# Patient Record
Sex: Male | Born: 1985 | Race: Black or African American | Hispanic: No | Marital: Single | Smoking: Former smoker
Health system: Southern US, Community
[De-identification: ages and names within clinical notes are randomized; demographics above are authoritative.]

## PROBLEM LIST (undated history)

## (undated) DIAGNOSIS — J40 Bronchitis, not specified as acute or chronic: Secondary | ICD-10-CM

---

## 2007-01-19 ENCOUNTER — Ambulatory Visit: Payer: Self-pay | Admitting: Internal Medicine

## 2007-01-20 ENCOUNTER — Ambulatory Visit: Payer: Self-pay | Admitting: *Deleted

## 2007-05-28 ENCOUNTER — Ambulatory Visit: Payer: Self-pay | Admitting: Family Medicine

## 2007-11-20 ENCOUNTER — Ambulatory Visit: Payer: Self-pay | Admitting: Internal Medicine

## 2009-04-04 ENCOUNTER — Emergency Department (HOSPITAL_COMMUNITY): Admission: EM | Admit: 2009-04-04 | Discharge: 2009-04-05 | Payer: Self-pay | Admitting: Emergency Medicine

## 2010-02-26 ENCOUNTER — Emergency Department (HOSPITAL_COMMUNITY): Admission: EM | Admit: 2010-02-26 | Discharge: 2010-02-26 | Payer: Self-pay | Admitting: Emergency Medicine

## 2010-03-02 ENCOUNTER — Emergency Department (HOSPITAL_COMMUNITY): Admission: EM | Admit: 2010-03-02 | Discharge: 2010-03-02 | Payer: Self-pay | Admitting: Emergency Medicine

## 2012-07-18 ENCOUNTER — Emergency Department (HOSPITAL_COMMUNITY)
Admission: EM | Admit: 2012-07-18 | Discharge: 2012-07-18 | Disposition: A | Discharge status: Home / Self Care | Payer: Self-pay | Attending: Emergency Medicine | Admitting: Emergency Medicine

## 2012-07-18 ENCOUNTER — Encounter (HOSPITAL_COMMUNITY): Payer: Self-pay | Admitting: *Deleted

## 2012-07-18 DIAGNOSIS — R112 Nausea with vomiting, unspecified: Secondary | ICD-10-CM | POA: Insufficient documentation

## 2012-07-18 DIAGNOSIS — F17200 Nicotine dependence, unspecified, uncomplicated: Secondary | ICD-10-CM | POA: Insufficient documentation

## 2012-07-18 DIAGNOSIS — R197 Diarrhea, unspecified: Secondary | ICD-10-CM | POA: Insufficient documentation

## 2012-07-18 LAB — COMPREHENSIVE METABOLIC PANEL
ALT: 46 U/L (ref 0–53)
AST: 37 U/L (ref 0–37)
CO2: 27 mEq/L (ref 19–32)
Calcium: 10.2 mg/dL (ref 8.4–10.5)
Chloride: 103 mEq/L (ref 96–112)
Creatinine, Ser: 1 mg/dL (ref 0.50–1.35)
GFR calc Af Amer: >90 mL/min (ref >90)
GFR calc non Af Amer: >90 mL/min (ref >90)
Glucose, Bld: 109 mg/dL — ABNORMAL HIGH (ref 70–99)
Total Bilirubin: 1.4 mg/dL — ABNORMAL HIGH (ref 0.3–1.2)
Total Protein: 7.8 g/dL (ref 6.0–8.3)

## 2012-07-18 LAB — CBC WITH DIFFERENTIAL/PLATELET
Basophils Absolute: 0 10*3/uL (ref 0.0–0.1)
Eosinophils Relative: 1 % (ref 0–5)
Lymphocytes Relative: 9 % — ABNORMAL LOW (ref 12–46)
Lymphs Abs: 0.9 10*3/uL (ref 0.7–4.0)
MCH: 30.6 pg (ref 26.0–34.0)
MCHC: 34.4 g/dL (ref 30.0–36.0)
Monocytes Absolute: 0.6 10*3/uL (ref 0.1–1.0)
Neutro Abs: 8.5 10*3/uL — ABNORMAL HIGH (ref 1.7–7.7)
Neutrophils Relative %: 84 % — ABNORMAL HIGH (ref 43–77)
Platelets: 148 10*3/uL — ABNORMAL LOW (ref 150–400)
WBC: 10.1 10*3/uL (ref 4.0–10.5)

## 2012-07-18 LAB — URINALYSIS, MICROSCOPIC ONLY
Glucose, UA: NEGATIVE mg/dL
Hgb urine dipstick: NEGATIVE
Ketones, ur: 15 mg/dL — AB
Nitrite: NEGATIVE
Protein, ur: 30 mg/dL — AB
Urobilinogen, UA: 1 mg/dL (ref 0.0–1.0)

## 2012-07-18 LAB — LIPASE, BLOOD: Lipase: 17 U/L (ref 11–59)

## 2012-07-18 MED ORDER — ONDANSETRON 8 MG PO TBDP: 8.0000 mg | ORAL_TABLET | Freq: Three times a day (TID) | ORAL | Status: DC | PRN

## 2012-07-18 MED ORDER — SODIUM CHLORIDE 0.9 % IV BOLUS (SEPSIS)
1000.0000 mL | Freq: Once | INTRAVENOUS | Status: AC
  Administered 2012-07-18: 1000 mL via INTRAVENOUS

## 2012-07-18 MED ORDER — ONDANSETRON 4 MG PO TBDP
8.0000 mg | ORAL_TABLET | Freq: Once | ORAL | Status: AC
  Administered 2012-07-18: 8 mg via ORAL
  Filled 2012-07-18: qty 2

## 2012-07-18 MED ORDER — ONDANSETRON HCL 4 MG/2ML IJ SOLN
4.0000 mg | Freq: Once | INTRAMUSCULAR | Status: AC
  Administered 2012-07-18: 4 mg via INTRAVENOUS
  Filled 2012-07-18: qty 2

## 2012-07-18 MED ORDER — HYDROMORPHONE HCL PF 1 MG/ML IJ SOLN
1.0000 mg | Freq: Once | INTRAMUSCULAR | Status: AC
  Administered 2012-07-18: 1 mg via INTRAMUSCULAR
  Filled 2012-07-18: qty 1

## 2012-07-18 NOTE — ED Provider Notes (Signed)
History     CSN: [id number]  Arrival date & time 07/18/12  0214   First MD Initiated Contact with Patient 07/18/12 [phone]      Chief Complaint  Patient presents with  . Abdominal Pain     The history is provided by the patient and a relative.   the patient reports developing nausea vomiting and diarrhea to the evening.  He denies hematemesis.  No melena or hematochezia.  Reports after one episode of vomiting he had acute right upper quadrant pain which is now improved.  He denies fevers or chills.  No recent sick contacts.  No myalgias.  No chest pain or shortness of breath.  No cough.  His symptoms are mild to moderate in severity.  Nothing worsens or improves his symptoms  History reviewed. No pertinent past medical history.  History reviewed. No pertinent past surgical history.  History reviewed. No pertinent family history.  History  Substance Use Topics  . Smoking status: Current Every Day Smoker -- 0.2 packs/day  . Smokeless tobacco: Not on file  . Alcohol Use: Yes      Review of Systems  Gastrointestinal: Positive for abdominal pain.  All other systems reviewed and are negative.    Allergies  Review of patient's allergies indicates no known allergies.  Home Medications  No current outpatient prescriptions on file.  BP 100/62  Pulse 74  Temp 98.4 F (36.9 C) (Oral)  Resp 16  Ht 5\' 7"  (1.702 m)  Wt 175 lb (79.379 kg)  BMI 27.41 kg/m2  SpO2 98%  Physical Exam  Nursing note and vitals reviewed. Constitutional: He is oriented to person, place, and time. He appears well-developed and well-nourished.  HENT:  Head: Normocephalic and atraumatic.  Eyes: EOM are normal.  Neck: Normal range of motion.  Cardiovascular: Normal rate, regular rhythm, normal heart sounds and intact distal pulses.   Pulmonary/Chest: Effort normal and breath sounds normal. No respiratory distress.  Abdominal: Soft. He exhibits no distension and no mass. There is no tenderness.    Musculoskeletal: Normal range of motion.  Neurological: He is alert and oriented to person, place, and time.  Skin: Skin is warm and dry.  Psychiatric: He has a normal mood and affect. Judgment normal.    ED Course  Procedures (including critical care time)  Labs Reviewed  CBC WITH DIFFERENTIAL - Abnormal; Notable for the following:    Platelets 148 (*)     Neutrophils Relative 84 (*)     Neutro Abs 8.5 (*)     Lymphocytes Relative 9 (*)     All other components within normal limits  COMPREHENSIVE METABOLIC PANEL - Abnormal; Notable for the following:    Glucose, Bld 109 (*)     Total Bilirubin 1.4 (*)     All other components within normal limits  URINALYSIS, MICROSCOPIC ONLY - Abnormal; Notable for the following:    Color, Urine AMBER (*)  BIOCHEMICALS MAY BE AFFECTED BY COLOR   APPearance HAZY (*)     Specific Gravity, Urine 1.037 (*)     Bilirubin Urine SMALL (*)     Ketones, ur 15 (*)     Protein, ur 30 (*)     All other components within normal limits  LIPASE, BLOOD   No results found.   1. Nausea vomiting and diarrhea       MDM  5:14 AM The patient feels much better at this time.  His symptoms are controlled.  His repeat abdominal exam is  benign.  Discharge home in good condition.        Lyanne Co, MD 07/18/12 [phone]

## 2012-07-18 NOTE — ED Notes (Signed)
The patient is AOx4 and comfortable with his discharge instructions. 

## 2012-07-18 NOTE — ED Notes (Signed)
Pt c/o n/v/d all night.  When vomiting experienced sharp RUQ abdominal pain.

## 2013-02-10 ENCOUNTER — Emergency Department (HOSPITAL_COMMUNITY)
Admission: EM | Admit: 2013-02-10 | Discharge: 2013-02-10 | Disposition: A | Discharge status: Home / Self Care | Payer: Self-pay | Attending: Emergency Medicine | Admitting: Emergency Medicine

## 2013-02-10 ENCOUNTER — Encounter (HOSPITAL_COMMUNITY): Payer: Self-pay

## 2013-02-10 DIAGNOSIS — R81 Glycosuria: Secondary | ICD-10-CM | POA: Insufficient documentation

## 2013-02-10 DIAGNOSIS — R197 Diarrhea, unspecified: Secondary | ICD-10-CM | POA: Insufficient documentation

## 2013-02-10 DIAGNOSIS — R112 Nausea with vomiting, unspecified: Secondary | ICD-10-CM | POA: Insufficient documentation

## 2013-02-10 DIAGNOSIS — F17200 Nicotine dependence, unspecified, uncomplicated: Secondary | ICD-10-CM | POA: Insufficient documentation

## 2013-02-10 LAB — COMPREHENSIVE METABOLIC PANEL
AST: 22 U/L (ref 0–37)
BUN: 17 mg/dL (ref 6–23)
CO2: 25 mEq/L (ref 19–32)
Chloride: 103 mEq/L (ref 96–112)
Creatinine, Ser: 0.91 mg/dL (ref 0.50–1.35)
GFR calc Af Amer: >90 mL/min (ref >90)
GFR calc non Af Amer: >90 mL/min (ref >90)
Glucose, Bld: 120 mg/dL — ABNORMAL HIGH (ref 70–99)
Total Bilirubin: 0.9 mg/dL (ref 0.3–1.2)

## 2013-02-10 LAB — CBC WITH DIFFERENTIAL/PLATELET
HCT: 42.6 % (ref 39.0–52.0)
Hemoglobin: 15 g/dL (ref 13.0–17.0)
Lymphocytes Relative: 47 % — ABNORMAL HIGH (ref 12–46)
Lymphs Abs: 2.1 10*3/uL (ref 0.7–4.0)
MCV: 88.6 fL (ref 78.0–100.0)
Monocytes Absolute: 0.3 10*3/uL (ref 0.1–1.0)
Monocytes Relative: 8 % (ref 3–12)
Neutro Abs: 1.9 10*3/uL (ref 1.7–7.7)
WBC: 4.5 10*3/uL (ref 4.0–10.5)

## 2013-02-10 LAB — URINALYSIS, ROUTINE W REFLEX MICROSCOPIC
Hgb urine dipstick: NEGATIVE
Protein, ur: NEGATIVE mg/dL
Urobilinogen, UA: 1 mg/dL (ref 0.0–1.0)

## 2013-02-10 LAB — POCT I-STAT, CHEM 8
Creatinine, Ser: 1 mg/dL (ref 0.50–1.35)
Glucose, Bld: 117 mg/dL — ABNORMAL HIGH (ref 70–99)
Hemoglobin: 17 g/dL (ref 13.0–17.0)
Sodium: 137 mEq/L (ref 135–145)
TCO2: 25 mmol/L (ref 0–100)

## 2013-02-10 MED ORDER — ONDANSETRON HCL 4 MG/2ML IJ SOLN
4.0000 mg | Freq: Once | INTRAMUSCULAR | Status: AC
  Administered 2013-02-10: 4 mg via INTRAVENOUS
  Filled 2013-02-10: qty 2

## 2013-02-10 MED ORDER — SODIUM CHLORIDE 0.9 % IV BOLUS (SEPSIS)
1000.0000 mL | Freq: Once | INTRAVENOUS | Status: AC
  Administered 2013-02-10: 1000 mL via INTRAVENOUS

## 2013-02-10 MED ORDER — DICYCLOMINE HCL 20 MG PO TABS
10.0000 mg | ORAL_TABLET | Freq: Once | ORAL | Status: AC
  Administered 2013-02-10: 10 mg via ORAL
  Filled 2013-02-10: qty 2

## 2013-02-10 MED ORDER — DICYCLOMINE HCL 10 MG PO CAPS: 10.0000 mg | ORAL_CAPSULE | Freq: Three times a day (TID) | ORAL | Status: DC

## 2013-02-10 MED ORDER — MORPHINE SULFATE 4 MG/ML IJ SOLN
2.0000 mg | Freq: Once | INTRAMUSCULAR | Status: AC
  Administered 2013-02-10: 2 mg via INTRAVENOUS
  Filled 2013-02-10: qty 1

## 2013-02-10 MED ORDER — PROMETHAZINE HCL 25 MG PO TABS: 25.0000 mg | ORAL_TABLET | Freq: Four times a day (QID) | ORAL | Status: DC | PRN

## 2013-02-10 NOTE — ED Notes (Signed)
Pt reports diarrhea for 2 weeks. Symptom accompanied by n/vx2 that started today. Pt states taking pepto bismol and ginger ale with no relief. Pt denies any fevers.

## 2013-02-10 NOTE — ED Provider Notes (Signed)
CSN: [id number]     Arrival date & time 02/10/13  0104 History     First MD Initiated Contact with Patient 02/10/13 0115     Chief Complaint  Patient presents with  . Abdominal Pain  . Nausea  . Emesis   (Consider location/radiation/quality/duration/timing/severity/associated sxs/prior Treatment) HPI  Dustin Watkins is a 27 y.o. male complaining of watery diarrhea stable and severity for 2 weeks. Patient denies hematochezia or melena. Patient has positive sick contacts: Coworkers were always in the day got better. Patient also had 2 episodes of nonbloody, nonbilious, non-coffee ground emesis today. Patient denies recent antibiotics, fever, recent travel, mucousy stool.  Patient reports mild discomfort after episodes of diarrhea and emesis rated at 5/10, described as aching and sharp. No Alleviating factors identified.   History reviewed. No pertinent past medical history. History reviewed. No pertinent past surgical history. No family history on file. History  Substance Use Topics  . Smoking status: Current Every Day Smoker -- 0.20 packs/day    Types: Cigarettes  . Smokeless tobacco: Not on file  . Alcohol Use: Yes    Review of Systems 10 systems reviewed and found to be negative, except as noted in the HPI   Allergies  Review of patient's allergies indicates no known allergies.  Home Medications   Current Outpatient Rx  Name  Route  Sig  Dispense  Refill  . bismuth subsalicylate (PEPTO BISMOL) 262 MG chewable tablet   Oral   Chew 524 mg by mouth as needed for indigestion.          BP 144/69  Pulse 79  Temp(Src) 98.9 F (37.2 C) (Oral)  Resp 18  SpO2 100% Physical Exam  Nursing note and vitals reviewed. Constitutional: He is oriented to person, place, and time. He appears well-developed and well-nourished. No distress.  HENT:  Head: Normocephalic.  Eyes: Conjunctivae and EOM are normal.  Cardiovascular: Normal rate.   Pulmonary/Chest: Effort normal. No  stridor. No respiratory distress. He has no wheezes. He has no rales. He exhibits no tenderness.  Abdominal: Soft. Bowel sounds are normal. He exhibits no distension and no mass. There is tenderness. There is no rebound and no guarding.  Mild diffuse tenderness to palpation, no guarding or rebound  Musculoskeletal: Normal range of motion.  Neurological: He is alert and oriented to person, place, and time.  Psychiatric: He has a normal mood and affect.    ED Course   Procedures (including critical care time)  Labs Reviewed  CBC WITH DIFFERENTIAL - Abnormal; Notable for the following:    Neutrophils Relative % 42 (*)    Lymphocytes Relative 47 (*)    All other components within normal limits  URINALYSIS, ROUTINE W REFLEX MICROSCOPIC - Abnormal; Notable for the following:    Specific Gravity, Urine 1.036 (*)    Glucose, UA 250 (*)    All other components within normal limits  COMPREHENSIVE METABOLIC PANEL   No results found. 1. Diarrhea   2. Glucosuria     MDM   Filed Vitals:   02/10/13 0120  BP: 144/69  Pulse: 79  Temp: 98.9 F (37.2 C)  TempSrc: Oral  Resp: 18  SpO2: 100%     Dustin Watkins is a 27 y.o. male with 2 weeks of diarrhea and 2 episodes of emesis today. No electrolyte abnormalities. Abdominal exam is benign. Due to the length of time that she's been having his symptoms I think further testing is indicated.  Patient is unable to  provide stool samples for testing in the ED. Patient will be sent home with a kit for sample collection and asked to return a sample to the emergency room.  Patient has elevated blood sugar and glucosuria. Upon further questioning it turns out the patient has a strong family history of diabetes and also poly-did see a polyuria. I will refer him to the [facility] for primary care management.  Medications  sodium chloride 0.9 % bolus 1,000 mL (not administered)  morphine 4 MG/ML injection 2 mg (not administered)   ondansetron (ZOFRAN) injection 4 mg (not administered)    Pt is hemodynamically stable, appropriate for, and amenable to discharge at this time. Pt verbalized understanding and agrees with care plan. All questions answered. Outpatient follow-up and specific return precautions discussed.    Discharge Medication List as of 02/10/2013  3:44 AM    START taking these medications   Details  dicyclomine (BENTYL) 10 MG capsule Take 1 capsule (10 mg total) by mouth 4 (four) times daily -  before meals and at bedtime., Starting 02/10/2013, Until Discontinued, Print    promethazine (PHENERGAN) 25 MG tablet Take 1 tablet (25 mg total) by mouth every 6 (six) hours as needed for nausea., Starting 02/10/2013, Until Discontinued, Print        Note: Portions of this report may have been transcribed using voice recognition software. Every effort was made to ensure accuracy; however, inadvertent computerized transcription errors may be present    Wynetta Emery, PA-C 02/10/13 [phone]

## 2013-02-10 NOTE — ED Notes (Signed)
Stool specimen not obtained--- pt was given instructions on how to collect specimen and to bring specimen back here when able; printed lab requisition provided.

## 2013-02-11 NOTE — ED Provider Notes (Signed)
Medical screening examination/treatment/procedure(s) were performed by non-physician practitioner and as supervising physician I was immediately available for consultation/collaboration.  Sunnie Nielsen, MD 02/11/13 [phone]

## 2013-03-01 ENCOUNTER — Encounter: Payer: Self-pay | Admitting: Internal Medicine

## 2013-03-01 ENCOUNTER — Ambulatory Visit: Payer: Self-pay | Attending: Internal Medicine | Admitting: Internal Medicine

## 2013-03-01 VITALS — BP 121/81 | HR 67 | Temp 98.6°F | Ht 67.0 in | Wt 157.4 lb

## 2013-03-01 DIAGNOSIS — R7303 Prediabetes: Secondary | ICD-10-CM | POA: Insufficient documentation

## 2013-03-01 DIAGNOSIS — R7309 Other abnormal glucose: Secondary | ICD-10-CM | POA: Insufficient documentation

## 2013-03-01 DIAGNOSIS — R739 Hyperglycemia, unspecified: Secondary | ICD-10-CM | POA: Insufficient documentation

## 2013-03-01 LAB — GLUCOSE, POCT (MANUAL RESULT ENTRY): POC Glucose: 96 mg/dl (ref 70–99)

## 2013-03-02 LAB — COMPLETE METABOLIC PANEL WITH GFR
CO2: 30 mEq/L (ref 19–32)
Creat: 1.02 mg/dL (ref 0.50–1.35)
GFR, Est African American: >89 mL/min
GFR, Est Non African American: >89 mL/min
Glucose, Bld: 98 mg/dL (ref 70–99)
Total Bilirubin: 0.9 mg/dL (ref 0.3–1.2)

## 2013-03-02 LAB — LIPID PANEL
Cholesterol: 143 mg/dL (ref 0–200)
HDL: 45 mg/dL (ref >39)
Triglycerides: 40 mg/dL (ref <150)

## 2013-03-02 NOTE — Progress Notes (Signed)
Patient ID: Dustin Watkins, male   DOB: Jan 28, 1986, 28 y.o.   MRN: [id number]  CC: To establish care  HPI: Patient is a 27 years old pleasant man seen clinic today to establish care. He was recently seen in the ED for gastroenteritis where he was noticed to have glycosuria and has been sent here today to be tested for diabetes. He has no significant past medical history, and no subjective symptoms today. He claims to be doing well, smoke cigarette about one half pack per day, drinks alcohol patient. There is extensive family history of diabetes.  No Known Allergies No past medical history on file. Current Outpatient Prescriptions on File Prior to Visit  Medication Sig Dispense Refill  . bismuth subsalicylate (PEPTO BISMOL) 262 MG chewable tablet Chew 524 mg by mouth as needed for indigestion.      . dicyclomine (BENTYL) 10 MG capsule Take 1 capsule (10 mg total) by mouth 4 (four) times daily -  before meals and at bedtime.  15 capsule  0  . promethazine (PHENERGAN) 25 MG tablet Take 1 tablet (25 mg total) by mouth every 6 (six) hours as needed for nausea.  12 tablet  0   No current facility-administered medications on file prior to visit.   No family history on file. History   Social History  . Marital Status: Single    Spouse Name: N/A    Number of Children: N/A  . Years of Education: N/A   Occupational History  . Not on file.   Social History Main Topics  . Smoking status: Current Every Day Smoker -- 0.20 packs/day    Types: Cigarettes  . Smokeless tobacco: Not on file  . Alcohol Use: Yes  . Drug Use: Yes    Special: Marijuana  . Sexual Activity: Not on file   Other Topics Concern  . Not on file   Social History Narrative  . No narrative on file    Review of Systems: Constitutional: Negative for fever, chills, diaphoresis, activity change, appetite change and fatigue. HENT: Negative for ear pain, nosebleeds, congestion, facial swelling, rhinorrhea, neck pain, neck  stiffness and ear discharge.  Eyes: Negative for pain, discharge, redness, itching and visual disturbance. Respiratory: Negative for cough, choking, chest tightness, shortness of breath, wheezing and stridor.  Cardiovascular: Negative for chest pain, palpitations and leg swelling. Gastrointestinal: Negative for abdominal distention. Genitourinary: Negative for dysuria, urgency, frequency, hematuria, flank pain, decreased urine volume, difficulty urinating and dyspareunia.  Musculoskeletal: Negative for back pain, joint swelling, arthralgias and gait problem. Neurological: Negative for dizziness, tremors, seizures, syncope, facial asymmetry, speech difficulty, weakness, light-headedness, numbness and headaches.  Hematological: Negative for adenopathy. Does not bruise/bleed easily. Psychiatric/Behavioral: Negative for hallucinations, behavioral problems, confusion, dysphoric mood, decreased concentration and agitation.    Objective:   Filed Vitals:   03/01/13 1711  BP: 121/81  Pulse: 67  Temp: 98.6 F (37 C)    Physical Exam: Constitutional: Patient appears well-developed and well-nourished. No distress. HENT: Normocephalic, atraumatic, External right and left ear normal. Oropharynx is clear and moist.  Eyes: Conjunctivae and EOM are normal. PERRLA, no scleral icterus. Neck: Normal ROM. Neck supple. No JVD. No tracheal deviation. No thyromegaly. CVS: RRR, S1/S2 +, no murmurs, no gallops, no carotid bruit.  Pulmonary: Effort and breath sounds normal, no stridor, rhonchi, wheezes, rales.  Abdominal: Soft. BS +,  no distension, tenderness, rebound or guarding.  Musculoskeletal: Normal range of motion. No edema and no tenderness.  Lymphadenopathy: No lymphadenopathy noted, cervical,  inguinal or axillary Neuro: Alert. Normal reflexes, muscle tone coordination. No cranial nerve deficit. Skin: Skin is warm and dry. No rash noted. Not diaphoretic. No erythema. No pallor. Psychiatric: Normal  mood and affect. Behavior, judgment, thought content normal.  Lab Results  Component Value Date   WBC 4.5 02/10/2013   HGB 17.0 02/10/2013   HCT 50.0 02/10/2013   MCV 88.6 02/10/2013   PLT 170 02/10/2013   Lab Results  Component Value Date   CREATININE 1.00 02/10/2013   BUN 7 02/10/2013   NA 137 02/10/2013   K 3.7 02/10/2013   CL 103 02/10/2013   CO2 25 02/10/2013    Lab Results  Component Value Date   HGBA1C 5.6 03/01/2013   Lipid Panel  No results found for this basename: chol, trig, hdl, cholhdl, vldl, ldlcalc       Assessment and plan:   Patient Active Problem List   Diagnosis Date Noted  . Hyperglycemia 03/01/2013  . Prediabetes 03/01/2013   Repeat CMP Urinalysis HbA1C  Will call patient with result of labs investigations Patient counseled about diet and exercise Smoking cessation counseling done today  YOVANY CLOCK was given clear instructions to go to ER or return to the clinic if symptoms don't improve, worsen or new problems develop.  Greggory Brandy verbalized understanding.  NIXXON FARIA was told to call to get lab results if hasn't heard anything in the next week.         Jeanann Lewandowsky, MD [facility] [place], [place] [phone]   03/02/2013, 12:02 AM

## 2013-03-10 ENCOUNTER — Telehealth: Payer: Self-pay

## 2013-03-10 NOTE — Telephone Encounter (Signed)
Left message on machine Blood work all normal

## 2013-03-10 NOTE — Telephone Encounter (Signed)
Message copied by Lestine Mount on Wed Mar 10, 2013  1:53 PM ------      Message from: Jeanann Lewandowsky E      Created: Wed Mar 10, 2013 12:08 PM       Please inform patient and all his lab came back normal ------

## 2013-05-23 ENCOUNTER — Encounter (HOSPITAL_COMMUNITY): Payer: Self-pay | Admitting: Emergency Medicine

## 2013-05-23 ENCOUNTER — Emergency Department (HOSPITAL_COMMUNITY)
Admission: EM | Admit: 2013-05-23 | Discharge: 2013-05-23 | Disposition: A | Discharge status: Home / Self Care | Payer: Self-pay | Attending: Emergency Medicine | Admitting: Emergency Medicine

## 2013-05-23 ENCOUNTER — Emergency Department (HOSPITAL_COMMUNITY): Payer: Self-pay

## 2013-05-23 DIAGNOSIS — X500XXA Overexertion from strenuous movement or load, initial encounter: Secondary | ICD-10-CM | POA: Insufficient documentation

## 2013-05-23 DIAGNOSIS — S93402A Sprain of unspecified ligament of left ankle, initial encounter: Secondary | ICD-10-CM

## 2013-05-23 DIAGNOSIS — F17200 Nicotine dependence, unspecified, uncomplicated: Secondary | ICD-10-CM | POA: Insufficient documentation

## 2013-05-23 DIAGNOSIS — S93409A Sprain of unspecified ligament of unspecified ankle, initial encounter: Secondary | ICD-10-CM | POA: Insufficient documentation

## 2013-05-23 DIAGNOSIS — Z8709 Personal history of other diseases of the respiratory system: Secondary | ICD-10-CM | POA: Insufficient documentation

## 2013-05-23 DIAGNOSIS — Y92009 Unspecified place in unspecified non-institutional (private) residence as the place of occurrence of the external cause: Secondary | ICD-10-CM | POA: Insufficient documentation

## 2013-05-23 DIAGNOSIS — Y9389 Activity, other specified: Secondary | ICD-10-CM | POA: Insufficient documentation

## 2013-05-23 HISTORY — DX: Bronchitis, not specified as acute or chronic: J40

## 2013-05-23 MED ORDER — HYDROCODONE-ACETAMINOPHEN 5-325 MG PO TABS
1.0000 | ORAL_TABLET | Freq: Once | ORAL | Status: AC
  Administered 2013-05-23: 1 via ORAL
  Filled 2013-05-23: qty 1

## 2013-05-23 MED ORDER — NAPROXEN 500 MG PO TABS: 500.0000 mg | ORAL_TABLET | Freq: Two times a day (BID) | ORAL | Status: DC

## 2013-05-23 NOTE — ED Notes (Signed)
Friday night playing with children, twisted left ankle, slight swelling, painful with weight

## 2013-05-23 NOTE — ED Provider Notes (Signed)
CSN: [id number]     Arrival date & time 05/23/13  2043 History   None    This chart was scribed for non-physician practitioner working with Dr. Fredderick Phenix by Dustin Watkins, ED Scribe. This patient was seen in room WTR6/WTR6 and the patient's care was started at 9:58 PM.   Chief Complaint  Patient presents with  . Ankle Pain   The history is provided by the patient. No language interpreter was used.   HPI Comments: Dustin Watkins is a 27 y.o. male who presents to the Emergency Department complaining of sudden onset, gradually worsening, constant ankle pain that started 2 days ago. Pt states he was playing with his children, and twisted his ankle in the yard. He also reports associated mild swelling. He says baring weight on the effected foot worsens the pain. He has tried ice to the effected area with mild relief. He also took 2 Aleve earlier today without any significant improvement of pain. Pt denies numbness or tingling.  Past Medical History  Diagnosis Date  . Bronchitis    History reviewed. No pertinent past surgical history. History reviewed. No pertinent family history. History  Substance Use Topics  . Smoking status: Current Every Day Smoker -- 0.20 packs/day    Types: Cigarettes  . Smokeless tobacco: Not on file  . Alcohol Use: Yes    Review of Systems  Musculoskeletal: Positive for arthralgias (ankle pain ).  Neurological: Negative for numbness.  All other systems reviewed and are negative.    Allergies  Review of patient's allergies indicates no known allergies.  Home Medications   Current Outpatient Rx  Name  Route  Sig  Dispense  Refill  . naproxen sodium (ANAPROX) 220 MG tablet   Oral   Take 440 mg by mouth daily as needed (pain).          BP 143/65  Pulse 80  Temp(Src) 98.1 F (36.7 C) (Oral)  Resp 18  SpO2 99%  Physical Exam  Nursing note and vitals reviewed. Constitutional: He is oriented to person, place, and time. He appears well-developed and  well-nourished.  HENT:  Head: Normocephalic and atraumatic.  Eyes: EOM are normal.  Neck: Normal range of motion.  Cardiovascular: Normal rate.   Pulmonary/Chest: Effort normal.  Musculoskeletal: Normal range of motion. He exhibits edema and tenderness.  Mild swelling with tenderness to palpation over anterior talofibular ligament No significant pain over lateral or medial malleolus  No deformities  No pain over proximal 5th metatarsal Normal DP pulses Normal sensations   Neurological: He is alert and oriented to person, place, and time.  Skin: Skin is warm and dry.  Psychiatric: He has a normal mood and affect. His behavior is normal.    ED Course  Procedures (including critical care time)  DIAGNOSTIC STUDIES: Oxygen Saturation is 99% on RA, Normal by my interpretation.    COORDINATION OF CARE: 11:08 PM- Will give pain medication. Will order X-Rays. Discussed treatment plan with pt at bedside and pt agreed to plan.    X-rays reviewed. No signs of fracture or other concerning injury. Patient with focal tenderness and swelling near the fibular ligament. Suspect mild strain at this time.     Imaging Review Dg Ankle Complete Left  05/23/2013   CLINICAL DATA:  Twisted left ankle on Friday with pain  EXAM: LEFT ANKLE COMPLETE - 3+ VIEW  COMPARISON:  None.  FINDINGS: There is no evidence of fracture, dislocation, or joint effusion. There is no evidence of arthropathy  or other focal bone abnormality. Soft tissues are unremarkable.  IMPRESSION: Negative.   Electronically Signed   By: Esperanza Heir M.D.   On: 05/23/2013 21:56     MDM   1. Ankle sprain, left, initial encounter      I personally performed the services described in this documentation, which was scribed in my presence. The recorded information has been reviewed and is accurate.   Angus Seller, PA-C 05/24/13 [phone]

## 2013-05-26 NOTE — ED Provider Notes (Signed)
Medical screening examination/treatment/procedure(s) were performed by non-physician practitioner and as supervising physician I was immediately available for consultation/collaboration.  EKG Interpretation   None         Rolan Bucco, MD 05/26/13 1101

## 2013-06-01 ENCOUNTER — Ambulatory Visit: Payer: Self-pay

## 2013-08-23 ENCOUNTER — Emergency Department (HOSPITAL_COMMUNITY): Payer: Self-pay

## 2013-08-23 ENCOUNTER — Emergency Department (HOSPITAL_COMMUNITY)
Admission: EM | Admit: 2013-08-23 | Discharge: 2013-08-23 | Disposition: A | Discharge status: Home / Self Care | Payer: Self-pay | Attending: Emergency Medicine | Admitting: Emergency Medicine

## 2013-08-23 ENCOUNTER — Encounter (HOSPITAL_COMMUNITY): Payer: Self-pay | Admitting: Emergency Medicine

## 2013-08-23 DIAGNOSIS — M654 Radial styloid tenosynovitis [de Quervain]: Secondary | ICD-10-CM | POA: Insufficient documentation

## 2013-08-23 DIAGNOSIS — F17200 Nicotine dependence, unspecified, uncomplicated: Secondary | ICD-10-CM | POA: Insufficient documentation

## 2013-08-23 DIAGNOSIS — Z8709 Personal history of other diseases of the respiratory system: Secondary | ICD-10-CM | POA: Insufficient documentation

## 2013-08-23 MED ORDER — IBUPROFEN 600 MG PO TABS: 600.0000 mg | ORAL_TABLET | Freq: Four times a day (QID) | ORAL | Status: DC | PRN

## 2013-08-23 NOTE — Discharge Instructions (Signed)
Call for a follow up appointment with a Family or Primary Care Provider.  °Return if Symptoms worsen.   °Take medication as prescribed.  ° ° °Emergency Department Resource Guide °1) Find a Doctor and Pay Out of Pocket °Although you won't have to find out who is covered by your insurance plan, it is a good idea to ask around and get recommendations. You will then need to call the office and see if the doctor you have chosen will accept you as a new patient and what types of options they offer for patients who are self-pay. Some doctors offer discounts or will set up payment plans for their patients who do not have insurance, but you will need to ask so you aren't surprised when you get to your appointment. ° °2) Contact Your Local Health Department °Not all health departments have doctors that can see patients for sick visits, but many do, so it is worth a call to see if yours does. If you don't know where your local health department is, you can check in your phone book. The CDC also has a tool to help you locate your state's health department, and many state websites also have listings of all of their local health departments. ° °3) Find a Walk-in Clinic °If your illness is not likely to be very severe or complicated, you may want to try a walk in clinic. These are popping up all over the country in pharmacies, drugstores, and shopping centers. They're usually staffed by nurse practitioners or physician assistants that have been trained to treat common illnesses and complaints. They're usually fairly quick and inexpensive. However, if you have serious medical issues or chronic medical problems, these are probably not your best option. ° °No Primary Care Doctor: °- Call [facility] at  832-8000 - they can help you locate a primary care doctor that  accepts your insurance, provides certain services, etc. °- Physician Referral Service- 1-800-533-3463 ° °Chronic Pain Problems: °Organization         Address  Phone    Notes  °[facility]  (336) 297-2271 Patients need to be referred by their primary care doctor.  ° °Medication Assistance: °Organization         Address  Phone   Notes  °[place] [facility] [address] °[place], [place] [address] (336) 641-8030 --Must be a resident of [place] °-- Must have NO insurance coverage whatsoever (no Medicaid/ Medicare, etc.) °-- The pt. MUST have a primary care doctor that directs their care regularly and follows them in the community °  °[facility]  (866) 331-1348   °[employer]  (888) 892-1162   ° °Agencies that provide inexpensive medical care: °Organization         Address  Phone   Notes  °[facility]  (336) 832-8035   °[facility]    (336) 832-7272   °[facility] [address] °[place], [place] [address] (336) 832-4777   °[facility] [address], °[place] (336) 271-4999   °[facility]    (336) 373-0678   °[facility]    (336) 272-1050   °[facility] ° [address], [place] Phone:  (336) 832-4444, Fax:  (336) 832-4440 Hours of Operation:  9 am - 6 pm, M-F.  Also accepts Medicaid/Medicare and self-pay.  °[facility] ° [address], [place]   Phone: (336) 832-3150, Fax: (336) 832-3151. Hours of Operation:  8:30 am - 5:30 pm, M-F.  Also accepts Medicaid and self-pay.  °[facility] [place] [address], [place] Phone: (336) 878-6027   °[facility] [address], [place], [place] (336)723-1848, Ext. 123 Mondays & Thursdays: 7-9 AM.  First 15 patients are seen on a first come, first serve basis. °  ° °Medicaid-accepting [place] Providers: ° °Organization         Address  Phone   Notes  °[facility] [address], [place] (336) 641-2100 Also accepts self-pay patients.  °[facility]  [address], [place] ° (336) 856-9996   °[facility] [address], [place] (336) 288-8857   °[facility] [address] [place] [address], [place] (336) 299-7000   °Veita Bland [address], [place]  ° (336) 373-1557 Only accepts [place] Access Medicaid patients after they have their name applied to their card.  ° °Self-Pay (no insurance) in [place]: ° °Organization         Address  Phone   Notes  °Sickle Cell Patients, [facility] [address], [place] (336) 832-1970   °[facility] [address], [place] (336) 832-4400   °[facility] ° [address], [place] (336) 992-4800   °[facility]/Dr. Osei-Bonsu ° [address] [place] [address] or [address], [place] (336) 841-8500 Phone number for both [place] and [place] locations is the same.  °[facility] [address], [place] (336) 299-0000   °[facility] [address] [place] [address], [place] or [address] (336) 852-7530 °(336) 878-2260   °[facility] [address], [place] (336) 350-1642, phone; (336) 294-5005, fax Sees patients 1st and 3rd Saturday of every month.  Must not qualify for public or private insurance (i.e. Medicaid, Medicare, [place] Health Choice, Veterans' Benefits) • Household income should be no more than 200% of the poverty level •The clinic cannot treat you if you are pregnant or think you are pregnant • Sexually transmitted diseases are not treated at the clinic.  ° ° °Dental Care: °Organization         Address  Phone  Notes  °[facility] [address], [place] (336) 641-6152 Accepts children up to age 21 who are enrolled in Medicaid or [place] Health Choice; pregnant women with a Medicaid card; and children who have  applied for Medicaid or [place] Health Choice, but were declined, whose parents can pay a reduced fee at time of service.  °[facility] [place]  [address], [place] (336) 641-7733 Accepts children up to age 21 who are enrolled in Medicaid or [place] Health Choice; pregnant women with a Medicaid card; and children who have applied for Medicaid or [place] Health Choice, but were declined, whose parents can pay a reduced fee at time of service.  °[facility] ° [address], [place] (336) 641-4533 Patients are seen by appointment only. Walk-ins are not accepted. [facility] will see patients 18 years of age and older. °Monday - Tuesday (8am-5pm) °Most Wednesdays (8:30-5pm) °$30 per visit, cash only  °[facility] ° [address], [place] (336) 641-4533 Patients are seen by appointment only. Walk-ins are not accepted. [facility] will see patients 18 years of age and older. °One Wednesday Evening (Monthly: Volunteer Based).  $30 per visit, cash only  °[facility]  (919) 537-3737 for adults; Children under age 4, call Graduate Pediatric Dentistry at (919) [phone]. Children aged 4-14, please call (919) 537-3737 to request a pediatric application. ° Dental services are provided in all areas of dental care including fillings, crowns and bridges, complete and partial dentures, implants, gum treatment, root canals, and extractions. Preventive care is also provided. Treatment is provided to both adults and children. °Patients are selected via a lottery and there is often a waiting list. °  °[facility] [address], °[place] ° (336) 763-8833 [email or web address] °  °[facility] [address], [place], [place] (336)723-1848, Ext. 123 Second and Fourth Thursday of each month, opens at 6:30 AM; Clinic ends at 9 AM.  Patients are seen on a first-come first-served basis, and a  limited number are seen during each clinic.  ° °[facility] ° [address], [place], [place] (336) 723-7904   Eligibility Requirements °You must have lived in [place], [place], or [place] counties for at least the last three months. °  You cannot be eligible for state or federal sponsored healthcare insurance, including Veterans Administration, Medicaid, or Medicare. °  You generally cannot be eligible for healthcare insurance through your employer.  °  How to apply: °Eligibility screenings are held every Tuesday and Wednesday afternoon from 1:00 pm until 4:00 pm. You do not need an appointment for the interview!  °[facility] [address], [place], [place] 336-631-2330   °[facility]  336-342-8273   °[facility]  336-703-3100   °[facility]  336-570-6415   ° °Behavioral Health Resources in the Community: °Intensive Outpatient Programs °Organization         Address  Phone  Notes  °[facility] [address], [place], [place] 336-878-6098   °[facility] [address], [place], [place] 336-832-9800   °[facility] [address], [place] ° 336-882-2125   °[facility] [address],  °[place], [place] 1-800-853-5163 or 336-641-4981   °Substance Abuse Resources °Organization         Address  Phone  Notes  °[facility]  336-882-2125   °[facility]  336-784-9470   °[facility]  336-285-9073   °[facility]  336-845-3988   °Residential & Outpatient Substance Abuse Program  1-800-659-3381   °Psychological Services °Organization         Address  Phone  Notes  °[facility]  336- 832-9600   °[facility]  336- 378-7881   °[facility] [address], [place] 1-800-853-5163 or 336-641-4981   ° °Mobile Crisis Teams °Organization          Address  Phone  Notes  °[facility], Mobile Crisis Care Unit  1-877-626-1772   °Assertive °[facility] ° [address] [place], [place] 336-834-9664   °Sharon DeEsch [address] °[place]  336-554-5454   ° °Self-Help/Support Groups °Organization         Address  Phone             Notes  °[facility] [place] - variety of   support groups  336- 373-1402 Call for more information  °Narcotics Anonymous (NA), [facility] [address], °[place]  2 meetings at this location  ° °Residential Treatment Programs °Organization         Address  Phone  Notes  °[facility] [address],    °[place]  1-866-801-8205   °[facility] ° [address], [place], [place] 704-293-8524   °[facility] [address], [place] 336-845-3988 Admissions: 8am-3pm M-F  °[facility] [address]    °[place], [place] 336-841-1104   °[facility] [address], [place], [place] 336-379-7146   °[facility] [address]  °[place], [place] 336-285-9073   °[facility] - Intensive Outpatient [address], [place], [place] 336-852-3033   °[facility] [address]  °[place], [place] 1-877-615-2722 or 336-784-9470   °[facility] [address] [place], [place] 336-227-7417 Accepts Medicaid  °[facility] [address]  °[place] 1-800-659-3381 Substance Abuse/Addiction Treatment  ° °[place] Behavioral Health Resources °Organization         Address  Phone  Notes  °[facility]  (888) 581-9988   °Julie Brannon, PhD [address] [place], [place]   (336) 349-5553 or (336) 951-0000   °[facility]   [address] °[place], [place] (336) 349-4454   °[facility] [address], [place], [place] (336) 342-8316 Insurance/Medicaid/sponsorship  through [facility]  °[facility] [address]                                    [place], [place] (336) 342-8316 Therapy/tele-psych/case  °[facility] [address]  ° [place], [place] (336) 349-2233    °Dr. Arfeen  (336) 349-4544   °[facility] [place]  [employer] [place] [facility] 1) [address], [place] °2) [address], [place] °3)  [address], [place] (336) 349-3220 °(336) 342-7768 ° °(336) 342-8140   °[place] [facility] (336) 342-1394 or (336) 342-3537 (After Hours)    ° °

## 2013-08-23 NOTE — ED Provider Notes (Signed)
CSN: [id number]     Arrival date & time 08/23/13  1336 History   This chart was scribed for non-physician practitioner Mellody Drown, PA-C working with Flint Melter, MD by Dorothey Baseman, ED Scribe. This patient was seen in room WTR9/WTR9 and the patient's care was started at 3:57 PM.    Chief Complaint  Patient presents with  . Wrist Pain   The history is provided by the patient. No language interpreter was used.   HPI Comments: Dustin Watkins is a 28 y.o. male who presents to the Emergency Department complaining of a constant pain with associated mild swelling to the left wrist and forearm onset about 4 days ago. He states that the pain is exacerbated with movement. Patient denies any potential injury or trauma to the area. He reports taking Aleve at home with mild, temporary relief. He denies history of prior injury to the area. He denies any numbness or paresthesias. Patient is right-handed. Patient has no other pertinent medical history.   Past Medical History  Diagnosis Date  . Bronchitis    History reviewed. No pertinent past surgical history. History reviewed. No pertinent family history. History  Substance Use Topics  . Smoking status: Current Every Day Smoker -- 0.20 packs/day    Types: Cigarettes  . Smokeless tobacco: Not on file  . Alcohol Use: Yes    Review of Systems  Musculoskeletal: Positive for arthralgias, joint swelling and myalgias.  Neurological: Negative for numbness.   Allergies  Review of patient's allergies indicates no known allergies.  Home Medications   Current Outpatient Rx  Name  Route  Sig  Dispense  Refill  . naproxen sodium (ANAPROX) 220 MG tablet   Oral   Take 440 mg by mouth daily as needed (pain).          BP 128/62  Pulse 82  Temp(Src) 98.6 F (37 C) (Oral)  Resp 16  SpO2 98%  Physical Exam  Nursing note and vitals reviewed. Constitutional: He is oriented to person, place, and time. He appears well-developed and well-nourished.  No distress.  HENT:  Head: Normocephalic and atraumatic.  Eyes: Conjunctivae are normal.  Neck: Normal range of motion. Neck supple.  Pulmonary/Chest: Effort normal. No respiratory distress.  Abdominal: He exhibits no distension.  Musculoskeletal: Normal range of motion. He exhibits tenderness.       Left wrist: He exhibits tenderness. He exhibits normal range of motion, no swelling, no crepitus and no deformity.  Positive Finkelstein's test on the left.  Neurological: He is alert and oriented to person, place, and time.  Distal sensation of all fingers of the left hand is intact.   Skin: Skin is warm and dry.  Psychiatric: He has a normal mood and affect. His behavior is normal.    ED Course  Procedures (including critical care time)  COORDINATION OF CARE: 3:59 PM- Discussed that x-ray results were negative and that symptoms are likely due to tendinitis. Advised patient to take ibuprofen and apply ice to the area at home to manage symptoms. Discussed treatment plan with patient at bedside and patient verbalized agreement.   Labs Review Labs Reviewed - No data to display  Imaging Review Dg Forearm Left  08/23/2013   CLINICAL DATA:  Pain  EXAM: LEFT FOREARM - 2 VIEW  COMPARISON:  None.  FINDINGS: Frontal and lateral views were obtained. No fracture or dislocation. No abnormal periosteal reaction. Joint spaces appear intact. Incidental note is made of a minus ulnar variant.  IMPRESSION:  No fracture or dislocation.   Electronically Signed   By: Bretta BangWilliam  Woodruff M.D.   On: 08/23/2013 14:34   Dg Wrist Complete Left  08/23/2013   CLINICAL DATA:  Pain pain via the had vein exit the lower care bowel healed is not is via lipoma PA while have hila had left testis to vertigo will gait spot April small lower left face and were not are but a half is for pylorus weakness in all  EXAM: LEFT WRIST - COMPLETE 3+ VIEW  COMPARISON:  None.  FINDINGS: Frontal, lateral, oblique, and ulnar deviation scaphoid  images were obtained. No fracture or dislocation. Joint spaces appear intact. No erosive change.  IMPRESSION: No abnormality noted.   Electronically Signed   By: Bretta BangWilliam  Woodruff M.D.   On: 08/23/2013 14:37    EKG Interpretation   None       MDM   Final diagnoses:  Tendinitis, de Quervain's   Pt with non-traumatic wrist pain. Positive Finkelstein's on left thumb.  XR without evidence of fracture or swelling. RICE encouraged. Discussed lab results, imaging results, and treatment plan with the patient. Return precautions given. Reports understanding and no other concerns at this time.  Patient is stable for discharge at this time.   Meds given in ED:  Medications - No data to display  Discharge Medication List as of 08/23/2013  4:15 PM    START taking these medications   Details  ibuprofen (ADVIL,MOTRIN) 600 MG tablet Take 1 tablet (600 mg total) by mouth every 6 (six) hours as needed. Take with meals, Starting 08/23/2013, Until Discontinued, Print       I personally performed the services described in this documentation, which was scribed in my presence. The recorded information has been reviewed and is accurate.      Clabe SealLauren M Deldrick Linch, PA-C 08/26/13 1216

## 2013-08-23 NOTE — ED Notes (Signed)
Pt states he has had L wrist pain for past 4 days. States yesterday finger and wrist muscles contracted involuntarily. Pt able to move wrist and forearm, no deformity noted.

## 2013-08-23 NOTE — Progress Notes (Signed)
[facility] provided pt with a list of primary care resources and [facility] information.  °

## 2013-08-26 NOTE — ED Provider Notes (Signed)
Medical screening examination/treatment/procedure(s) were performed by non-physician practitioner and as supervising physician I was immediately available for consultation/collaboration.  EKG Interpretation   None        Flint MelterElliott L Landy Mace, MD 08/26/13 1623

## 2013-09-23 ENCOUNTER — Encounter (HOSPITAL_COMMUNITY): Payer: Self-pay | Admitting: Emergency Medicine

## 2013-09-23 ENCOUNTER — Emergency Department (HOSPITAL_COMMUNITY)
Admission: EM | Admit: 2013-09-23 | Discharge: 2013-09-23 | Disposition: A | Discharge status: Home / Self Care | Payer: Self-pay | Attending: Emergency Medicine | Admitting: Emergency Medicine

## 2013-09-23 DIAGNOSIS — A084 Viral intestinal infection, unspecified: Secondary | ICD-10-CM | POA: Diagnosis present

## 2013-09-23 DIAGNOSIS — Z8709 Personal history of other diseases of the respiratory system: Secondary | ICD-10-CM | POA: Insufficient documentation

## 2013-09-23 DIAGNOSIS — A088 Other specified intestinal infections: Secondary | ICD-10-CM | POA: Insufficient documentation

## 2013-09-23 DIAGNOSIS — F17200 Nicotine dependence, unspecified, uncomplicated: Secondary | ICD-10-CM | POA: Insufficient documentation

## 2013-09-23 LAB — COMPREHENSIVE METABOLIC PANEL
ALBUMIN: 4.2 g/dL (ref 3.5–5.2)
ALK PHOS: 76 U/L (ref 39–117)
ALT: 33 U/L (ref 0–53)
AST: 22 U/L (ref 0–37)
BILIRUBIN TOTAL: 1.3 mg/dL — AB (ref 0.3–1.2)
BUN: 20 mg/dL (ref 6–23)
CHLORIDE: 104 meq/L (ref 96–112)
CO2: 23 mEq/L (ref 19–32)
Calcium: 9.4 mg/dL (ref 8.4–10.5)
Creatinine, Ser: 0.85 mg/dL (ref 0.50–1.35)
GFR calc Af Amer: >90 mL/min (ref >90)
GFR calc non Af Amer: >90 mL/min (ref >90)
Glucose, Bld: 116 mg/dL — ABNORMAL HIGH (ref 70–99)
POTASSIUM: 3.7 meq/L (ref 3.7–5.3)
Sodium: 141 mEq/L (ref 137–147)
Total Protein: 7.1 g/dL (ref 6.0–8.3)

## 2013-09-23 LAB — CBC WITH DIFFERENTIAL/PLATELET
BASOS ABS: 0 10*3/uL (ref 0.0–0.1)
BASOS PCT: 0 % (ref 0–1)
Eosinophils Absolute: 0 10*3/uL (ref 0.0–0.7)
Eosinophils Relative: 0 % (ref 0–5)
HCT: 45 % (ref 39.0–52.0)
HEMOGLOBIN: 15.9 g/dL (ref 13.0–17.0)
Lymphocytes Relative: 4 % — ABNORMAL LOW (ref 12–46)
Lymphs Abs: 0.3 10*3/uL — ABNORMAL LOW (ref 0.7–4.0)
MCH: 30.7 pg (ref 26.0–34.0)
MCHC: 35.3 g/dL (ref 30.0–36.0)
MCV: 86.9 fL (ref 78.0–100.0)
Monocytes Absolute: 0.3 10*3/uL (ref 0.1–1.0)
Monocytes Relative: 3 % (ref 3–12)
NEUTROS ABS: 7.1 10*3/uL (ref 1.7–7.7)
Neutrophils Relative %: 92 % — ABNORMAL HIGH (ref 43–77)
Platelets: 145 10*3/uL — ABNORMAL LOW (ref 150–400)
RBC: 5.18 MIL/uL (ref 4.22–5.81)
RDW: 12.5 % (ref 11.5–15.5)
WBC: 7.8 10*3/uL (ref 4.0–10.5)

## 2013-09-23 LAB — URINALYSIS, ROUTINE W REFLEX MICROSCOPIC
Bilirubin Urine: NEGATIVE
GLUCOSE, UA: 250 mg/dL — AB
Hgb urine dipstick: NEGATIVE
KETONES UR: NEGATIVE mg/dL
Leukocytes, UA: NEGATIVE
Nitrite: NEGATIVE
PROTEIN: NEGATIVE mg/dL
Specific Gravity, Urine: 1.03 (ref 1.005–1.030)
Urobilinogen, UA: 0.2 mg/dL (ref 0.0–1.0)
pH: 5.5 (ref 5.0–8.0)

## 2013-09-23 LAB — LIPASE, BLOOD: LIPASE: 21 U/L (ref 11–59)

## 2013-09-23 MED ORDER — ONDANSETRON 4 MG PO TBDP: ORAL_TABLET | ORAL | Status: DC

## 2013-09-23 MED ORDER — HYDROMORPHONE HCL PF 1 MG/ML IJ SOLN
0.5000 mg | INTRAMUSCULAR | Status: AC
  Administered 2013-09-23: 0.5 mg via INTRAVENOUS
  Filled 2013-09-23: qty 1

## 2013-09-23 MED ORDER — SODIUM CHLORIDE 0.9 % IV BOLUS (SEPSIS)
1000.0000 mL | INTRAVENOUS | Status: AC
  Administered 2013-09-23: 1000 mL via INTRAVENOUS

## 2013-09-23 MED ORDER — ONDANSETRON HCL 4 MG/2ML IJ SOLN
4.0000 mg | Freq: Once | INTRAMUSCULAR | Status: AC
  Administered 2013-09-23: 4 mg via INTRAVENOUS
  Filled 2013-09-23: qty 2

## 2013-09-23 NOTE — ED Provider Notes (Signed)
CSN: [id number]     Arrival date & time 09/23/13  1103 History   First MD Initiated Contact with Patient 09/23/13 1143     Chief Complaint  Patient presents with  . Emesis     (Consider location/radiation/quality/duration/timing/severity/associated sxs/prior Treatment) Patient is a 28 y.o. male presenting with vomiting. The history is provided by the patient.  Emesis Severity:  Moderate Duration:  6 hours Timing:  Constant Quality:  Stomach contents Progression:  Unchanged Chronicity:  New Recent urination:  Normal Relieved by:  Nothing Worsened by:  Nothing tried Ineffective treatments:  None tried Associated symptoms: abdominal pain and diarrhea   Associated symptoms: no fever and no headaches   Abdominal pain:    Location:  Generalized   Quality:  Cramping   Severity:  Mild   Onset quality:  Sudden   Duration:  6 hours   Timing:  Constant   Progression:  Improving   Chronicity:  New Diarrhea:    Quality:  Watery   Severity:  Moderate   Past Medical History  Diagnosis Date  . Bronchitis    History reviewed. No pertinent past surgical history. No family history on file. History  Substance Use Topics  . Smoking status: Current Every Day Smoker -- 0.20 packs/day    Types: Cigarettes  . Smokeless tobacco: Not on file  . Alcohol Use: Yes    Review of Systems  Constitutional: Negative for fever.  HENT: Negative for drooling and rhinorrhea.   Eyes: Negative for pain.  Respiratory: Negative for cough and shortness of breath.   Cardiovascular: Negative for chest pain and leg swelling.  Gastrointestinal: Positive for nausea, vomiting, abdominal pain and diarrhea.  Genitourinary: Negative for dysuria and hematuria.  Musculoskeletal: Negative for gait problem and neck pain.  Skin: Negative for color change.  Neurological: Negative for numbness and headaches.  Hematological: Negative for adenopathy.  Psychiatric/Behavioral: Negative for behavioral problems.  All  other systems reviewed and are negative.      Allergies  Review of patient's allergies indicates no known allergies.  Home Medications  No current outpatient prescriptions on file. BP 121/72  Pulse 90  Temp(Src) 98.4 F (36.9 C) (Oral)  Resp 20  SpO2 100% Physical Exam  Nursing note and vitals reviewed. Constitutional: He is oriented to person, place, and time. He appears well-developed and well-nourished.  HENT:  Head: Normocephalic and atraumatic.  Right Ear: External ear normal.  Left Ear: External ear normal.  Nose: Nose normal.  Mouth/Throat: Oropharynx is clear and moist. No oropharyngeal exudate.  Eyes: Conjunctivae and EOM are normal. Pupils are equal, round, and reactive to light.  Neck: Normal range of motion. Neck supple.  Cardiovascular: Normal rate, regular rhythm, normal heart sounds and intact distal pulses.  Exam reveals no gallop and no friction rub.   No murmur heard. Pulmonary/Chest: Effort normal and breath sounds normal. No respiratory distress. He has no wheezes.  Abdominal: Soft. Bowel sounds are normal. He exhibits no distension. There is no tenderness. There is no rebound and no guarding.  Musculoskeletal: Normal range of motion. He exhibits no edema and no tenderness.  Neurological: He is alert and oriented to person, place, and time.  Skin: Skin is warm and dry.  Psychiatric: He has a normal mood and affect. His behavior is normal.    ED Course  Procedures (including critical care time) Labs Review Labs Reviewed  CBC WITH DIFFERENTIAL - Abnormal; Notable for the following:    Platelets 145 (*)    Neutrophils  Relative % 92 (*)    Lymphocytes Relative 4 (*)    Lymphs Abs 0.3 (*)    All other components within normal limits  COMPREHENSIVE METABOLIC PANEL - Abnormal; Notable for the following:    Glucose, Bld 116 (*)    Total Bilirubin 1.3 (*)    All other components within normal limits  URINALYSIS, ROUTINE W REFLEX MICROSCOPIC - Abnormal;  Notable for the following:    Glucose, UA 250 (*)    All other components within normal limits  LIPASE, BLOOD   Imaging Review No results found.   EKG Interpretation None      MDM   Final diagnoses:  Viral gastroenteritis    11:48 AM 28 y.o. male who presents with nausea, vomiting, diarrhea, and abdominal cramping which began abruptly at 6 AM this morning. Denies any blood in his emesis or stool. He denies any fevers. He is afebrile and vital signs are unremarkable here. Screening labwork are descended prior to my evaluation. Abdomen is soft and benign my exam. Likely viral gastroenteritis. Will get symptomatic control.  1:13 PM: I interpreted/reviewed the labs and/or imaging which were non-contributory.  Pt feeling better, abd remains soft/benign, he continues to appear well.  I have discussed the diagnosis/risks/treatment options with the patient and believe the pt to be eligible for discharge home to follow-up with pcp as needed. We also discussed returning to the ED immediately if new or worsening sx occur. We discussed the sx which are most concerning (e.g., worsening pain, fever, inability to tolerate po) that necessitate immediate return. Medications administered to the patient during their visit and any new prescriptions provided to the patient are listed below.  Medications given during this visit Medications  HYDROmorphone (DILAUDID) injection 0.5 mg (0.5 mg Intravenous Given 09/23/13 1207)  ondansetron (ZOFRAN) injection 4 mg (4 mg Intravenous Given 09/23/13 1207)  sodium chloride 0.9 % bolus 1,000 mL (1,000 mLs Intravenous New Bag/Given 09/23/13 1207)    Discharge Medication List as of 09/23/2013  1:14 PM    START taking these medications   Details  ondansetron (ZOFRAN ODT) 4 MG disintegrating tablet 4mg  ODT q4 hours prn nausea/vomit, Print         Junius ArgyleForrest S Axyl Sitzman, MD 09/23/13 1536

## 2013-09-23 NOTE — ED Notes (Signed)
Patient is aware that a urine sample is needed, but is unable to give a sample at this time. Urinal has been left at bedside.

## 2013-09-23 NOTE — Progress Notes (Signed)
[facility] provided pt with a list of primary care resources to help patient establish primary care.  °

## 2013-09-23 NOTE — ED Notes (Signed)
Per pt, states V/D since 6am-abdominal pain

## 2014-05-02 ENCOUNTER — Encounter (HOSPITAL_COMMUNITY): Payer: Self-pay | Admitting: Emergency Medicine

## 2014-05-02 ENCOUNTER — Emergency Department (HOSPITAL_COMMUNITY)
Admission: EM | Admit: 2014-05-02 | Discharge: 2014-05-02 | Disposition: A | Discharge status: Home / Self Care | Payer: PRIVATE HEALTH INSURANCE | Attending: Emergency Medicine | Admitting: Emergency Medicine

## 2014-05-02 ENCOUNTER — Emergency Department (HOSPITAL_COMMUNITY): Payer: PRIVATE HEALTH INSURANCE

## 2014-05-02 DIAGNOSIS — Z720 Tobacco use: Secondary | ICD-10-CM | POA: Diagnosis not present

## 2014-05-02 DIAGNOSIS — R0789 Other chest pain: Secondary | ICD-10-CM | POA: Insufficient documentation

## 2014-05-02 DIAGNOSIS — J069 Acute upper respiratory infection, unspecified: Secondary | ICD-10-CM | POA: Diagnosis not present

## 2014-05-02 DIAGNOSIS — R079 Chest pain, unspecified: Secondary | ICD-10-CM | POA: Diagnosis present

## 2014-05-02 LAB — CBC
HCT: 43.4 % (ref 39.0–52.0)
HEMOGLOBIN: 15.1 g/dL (ref 13.0–17.0)
MCH: 30.6 pg (ref 26.0–34.0)
MCHC: 34.8 g/dL (ref 30.0–36.0)
MCV: 87.9 fL (ref 78.0–100.0)
Platelets: 164 10*3/uL (ref 150–400)
RBC: 4.94 MIL/uL (ref 4.22–5.81)
RDW: 12.6 % (ref 11.5–15.5)
WBC: 4.3 10*3/uL (ref 4.0–10.5)

## 2014-05-02 LAB — I-STAT TROPONIN, ED: Troponin i, poc: 0.01 ng/mL (ref 0.00–0.08)

## 2014-05-02 LAB — BASIC METABOLIC PANEL
Anion gap: 10 (ref 5–15)
BUN: 10 mg/dL (ref 6–23)
CO2: 24 meq/L (ref 19–32)
Calcium: 8.5 mg/dL (ref 8.4–10.5)
Chloride: 102 mEq/L (ref 96–112)
Creatinine, Ser: 1.01 mg/dL (ref 0.50–1.35)
GFR calc Af Amer: >90 mL/min (ref >90)
Glucose, Bld: 121 mg/dL — ABNORMAL HIGH (ref 70–99)
POTASSIUM: 3.4 meq/L — AB (ref 3.7–5.3)
Sodium: 136 mEq/L — ABNORMAL LOW (ref 137–147)

## 2014-05-02 MED ORDER — KETOROLAC TROMETHAMINE 60 MG/2ML IM SOLN
60.0000 mg | Freq: Once | INTRAMUSCULAR | Status: AC
  Administered 2014-05-02: 60 mg via INTRAMUSCULAR
  Filled 2014-05-02: qty 2

## 2014-05-02 MED ORDER — IBUPROFEN 600 MG PO TABS: 600.0000 mg | ORAL_TABLET | Freq: Four times a day (QID) | ORAL | Status: DC | PRN

## 2014-05-02 NOTE — ED Notes (Addendum)
Pt reports chest pain and sob that began at 0600 this am. No dizziness, cough or nausea. Pt speaking in complete sentences with no difficulty. Lungs clear. Pain decreased with deep breath

## 2014-05-02 NOTE — ED Provider Notes (Signed)
CSN: [id number]     Arrival date & time 05/02/14  1209 History   First MD Initiated Contact with Patient 05/02/14 1236     Chief Complaint  Patient presents with  . Chest Pain  . Shortness of Breath     (Consider location/radiation/quality/duration/timing/severity/associated sxs/prior Treatment) HPI  This is a 28 year old male who presents with chest pain and shortness of breath. Reports onset of symptoms earlier this morning. Reports a nonproductive cough over last 2-3 days. Otherwise denies any sore throat, fever. Denies any recent hospitalizations, history of blood clot, recent travel. He has not taken anything for his pain.  Currently his pain is 9/10. He reports that it is sharp and nonradiating. He does not identify any exacerbating or alleviating factors. He states that he has similar pain and had bronchitis in the past. Past Medical History  Diagnosis Date  . Bronchitis    History reviewed. No pertinent past surgical history. History reviewed. No pertinent family history. History  Substance Use Topics  . Smoking status: Current Every Day Smoker -- 0.20 packs/day    Types: Cigarettes  . Smokeless tobacco: Not on file  . Alcohol Use: Yes    Review of Systems  Constitutional: Negative.  Negative for fever.  Respiratory: Positive for cough and shortness of breath. Negative for chest tightness.   Cardiovascular: Positive for chest pain. Negative for leg swelling.  Gastrointestinal: Negative.  Negative for abdominal pain.  Genitourinary: Negative.  Negative for dysuria.  Musculoskeletal: Negative for back pain.  All other systems reviewed and are negative.     Allergies  Review of patient's allergies indicates no known allergies.  Home Medications   Prior to Admission medications   Medication Sig Start Date End Date Taking? Authorizing Provider  ibuprofen (ADVIL,MOTRIN) 600 MG tablet Take 1 tablet (600 mg total) by mouth every 6 (six) hours as needed. 05/02/14    Shon Batonourtney F Jacquelynne Guedes, MD   BP 125/67  Pulse 68  Temp(Src) 98.3 F (36.8 C) (Oral)  Resp 18  SpO2 98% Physical Exam  Nursing note and vitals reviewed. Constitutional: He is oriented to person, place, and time. He appears well-developed and well-nourished. No distress.  HENT:  Head: Normocephalic and atraumatic.  Eyes: Pupils are equal, round, and reactive to light.  Neck: Neck supple.  Cardiovascular: Normal rate, regular rhythm and normal heart sounds.   No murmur heard. Pulmonary/Chest: Effort normal and breath sounds normal. No respiratory distress. He has no wheezes. He exhibits tenderness.  Tenderness palpation over the left chest wall  Abdominal: Soft. There is no tenderness.  Musculoskeletal: He exhibits no edema.  Lymphadenopathy:    He has no cervical adenopathy.  Neurological: He is alert and oriented to person, place, and time.  Skin: Skin is warm and dry.  Psychiatric: He has a normal mood and affect.    ED Course  Procedures (including critical care time) Labs Review Labs Reviewed  BASIC METABOLIC PANEL - Abnormal; Notable for the following:    Sodium 136 (*)    Potassium 3.4 (*)    Glucose, Bld 121 (*)    All other components within normal limits  CBC  I-STAT TROPOININ, ED    Imaging Review Dg Chest 2 View (if Patient Has Fever And/or Copd)  05/02/2014   CLINICAL DATA:  Initial encounter for left chest pain with shortness of breath and cough that started at 0600 today.  EXAM: CHEST  2 VIEW  COMPARISON:  None.  FINDINGS: The lungs are clear without focal  infiltrate, edema, pneumothorax or pleural effusion. The cardiopericardial silhouette is within normal limits for size. Imaged bony structures of the thorax are intact. Telemetry leads overlie the chest.  IMPRESSION: No active cardiopulmonary disease.   Electronically Signed   By: Kennith CenterEric  Mansell M.D.   On: 05/02/2014 13:46     EKG Interpretation   Date/Time:  Monday May 02 2014 12:15:18 EDT Ventricular  Rate:  76 PR Interval:  124 QRS Duration: 70 QT Interval:  397 QTC Calculation: 446 R Axis:   75 Text Interpretation:  Sinus rhythm Probable left atrial enlargement no  prior for comparison Confirmed by Lailynn Southgate  MD, Toni AmendOURTNEY ([id number]) on  05/02/2014 12:38:23 PM      MDM   Final diagnoses:  Chest wall pain  URI (upper respiratory infection)    Patient presents with chest pain shortness of breath. Nontoxic on exam. Recent cough but no other symptoms. Tenderness is reproducible on exam. Suspect musculoskeletal etiology. Patient is PERC negative. EKG, chest x-ray, and basic labwork is reassuring.  Patient given IM Toradol. Suspect early URI with chest wall pain. Patient instructed to use ibuprofen at home for pain. No indication at this time for bronchodilators or steroids.  After history, exam, and medical workup I feel the patient has been appropriately medically screened and is safe for discharge home. Pertinent diagnoses were discussed with the patient. Patient was given return precautions.     Shon Batonourtney F Raeden Belzer, MD 05/02/14 [phone]

## 2014-05-02 NOTE — Discharge Instructions (Signed)
Chest Wall Pain °Chest wall pain is pain in or around the bones and muscles of your chest. It may take up to 6 weeks to get better. It may take longer if you must stay physically active in your work and activities.  °CAUSES  °Chest wall pain may happen on its own. However, it may be caused by: °· A viral illness like the flu. °· Injury. °· Coughing. °· Exercise. °· Arthritis. °· Fibromyalgia. °· Shingles. °HOME CARE INSTRUCTIONS  °· Avoid overtiring physical activity. Try not to strain or perform activities that cause pain. This includes any activities using your chest or your abdominal and side muscles, especially if heavy weights are used. °· Put ice on the sore area. °· Put ice in a plastic bag. °· Place a towel between your skin and the bag. °· Leave the ice on for 15-20 minutes per hour while awake for the first 2 days. °· Only take over-the-counter or prescription medicines for pain, discomfort, or fever as directed by your caregiver. °SEEK IMMEDIATE MEDICAL CARE IF:  °· Your pain increases, or you are very uncomfortable. °· You have a fever. °· Your chest pain becomes worse. °· You have new, unexplained symptoms. °· You have nausea or vomiting. °· You feel sweaty or lightheaded. °· You have a cough with phlegm (sputum), or you cough up blood. °MAKE SURE YOU:  °· Understand these instructions. °· Will watch your condition. °· Will get help right away if you are not doing well or get worse. °Document Released: 07/01/2005 Document Revised: 09/23/2011 Document Reviewed: 02/25/2011 °ExitCare® Patient Information ©2015 [employer]. This information is not intended to replace advice given to you by your health care provider. Make sure you discuss any questions you have with your health care provider. °Upper Respiratory Infection, Adult °An upper respiratory infection (URI) is also sometimes known as the common cold. The upper respiratory tract includes the nose, sinuses, throat, trachea, and bronchi. Bronchi are  the airways leading to the lungs. Most people improve within 1 week, but symptoms can last up to 2 weeks. A residual cough may last even longer.  °CAUSES °Many different viruses can infect the tissues lining the upper respiratory tract. The tissues become irritated and inflamed and often become very moist. Mucus production is also common. A cold is contagious. You can easily spread the virus to others by oral contact. This includes kissing, sharing a glass, coughing, or sneezing. Touching your mouth or nose and then touching a surface, which is then touched by another person, can also spread the virus. °SYMPTOMS  °Symptoms typically develop 1 to 3 days after you come in contact with a cold virus. Symptoms vary from person to person. They may include: °· Runny nose. °· Sneezing. °· Nasal congestion. °· Sinus irritation. °· Sore throat. °· Loss of voice (laryngitis). °· Cough. °· Fatigue. °· Muscle aches. °· Loss of appetite. °· Headache. °· Low-grade fever. °DIAGNOSIS  °You might diagnose your own cold based on familiar symptoms, since most people get a cold 2 to 3 times a year. Your caregiver can confirm this based on your exam. Most importantly, your caregiver can check that your symptoms are not due to another disease such as strep throat, sinusitis, pneumonia, asthma, or epiglottitis. Blood tests, throat tests, and X-rays are not necessary to diagnose a common cold, but they may sometimes be helpful in excluding other more serious diseases. Your caregiver will decide if any further tests are required. °RISKS AND COMPLICATIONS  °You may   be at risk for a more severe case of the common cold if you smoke cigarettes, have chronic heart disease (such as heart failure) or lung disease (such as asthma), or if you have a weakened immune system. The very young and very old are also at risk for more serious infections. Bacterial sinusitis, middle ear infections, and bacterial pneumonia can complicate the common cold. The  common cold can worsen asthma and chronic obstructive pulmonary disease (COPD). Sometimes, these complications can require emergency medical care and may be life-threatening. °PREVENTION  °The best way to protect against getting a cold is to practice good hygiene. Avoid oral or hand contact with people with cold symptoms. Wash your hands often if contact occurs. There is no clear evidence that vitamin C, vitamin E, echinacea, or exercise reduces the chance of developing a cold. However, it is always recommended to get plenty of rest and practice good nutrition. °TREATMENT  °Treatment is directed at relieving symptoms. There is no cure. Antibiotics are not effective, because the infection is caused by a virus, not by bacteria. Treatment may include: °· Increased fluid intake. Sports drinks offer valuable electrolytes, sugars, and fluids. °· Breathing heated mist or steam (vaporizer or shower). °· Eating chicken soup or other clear broths, and maintaining good nutrition. °· Getting plenty of rest. °· Using gargles or lozenges for comfort. °· Controlling fevers with ibuprofen or acetaminophen as directed by your caregiver. °· Increasing usage of your inhaler if you have asthma. °Zinc gel and zinc lozenges, taken in the first 24 hours of the common cold, can shorten the duration and lessen the severity of symptoms. Pain medicines may help with fever, muscle aches, and throat pain. A variety of non-prescription medicines are available to treat congestion and runny nose. Your caregiver can make recommendations and may suggest nasal or lung inhalers for other symptoms.  °HOME CARE INSTRUCTIONS  °· Only take over-the-counter or prescription medicines for pain, discomfort, or fever as directed by your caregiver. °· Use a warm mist humidifier or inhale steam from a shower to increase air moisture. This may keep secretions moist and make it easier to breathe. °· Drink enough water and fluids to keep your urine clear or pale  yellow. °· Rest as needed. °· Return to work when your temperature has returned to normal or as your caregiver advises. You may need to stay home longer to avoid infecting others. You can also use a face mask and careful hand washing to prevent spread of the virus. °SEEK MEDICAL CARE IF:  °· After the first few days, you feel you are getting worse rather than better. °· You need your caregiver's advice about medicines to control symptoms. °· You develop chills, worsening shortness of breath, or brown or red sputum. These may be signs of pneumonia. °· You develop yellow or brown nasal discharge or pain in the face, especially when you bend forward. These may be signs of sinusitis. °· You develop a fever, swollen neck glands, pain with swallowing, or white areas in the back of your throat. These may be signs of strep throat. °SEEK IMMEDIATE MEDICAL CARE IF:  °· You have a fever. °· You develop severe or persistent headache, ear pain, sinus pain, or chest pain. °· You develop wheezing, a prolonged cough, cough up blood, or have a change in your usual mucus (if you have chronic lung disease). °· You develop sore muscles or a stiff neck. °Document Released: 12/25/2000 Document Revised: 09/23/2011 Document Reviewed: 10/06/2013 °ExitCare® Patient   Information ©2015 [employer]. This information is not intended to replace advice given to you by your health care provider. Make sure you discuss any questions you have with your health care provider. ° °

## 2014-06-30 ENCOUNTER — Encounter (HOSPITAL_COMMUNITY): Payer: Self-pay | Admitting: Emergency Medicine

## 2014-06-30 ENCOUNTER — Emergency Department (HOSPITAL_COMMUNITY)
Admission: EM | Admit: 2014-06-30 | Discharge: 2014-07-01 | Disposition: A | Discharge status: Home / Self Care | Payer: PRIVATE HEALTH INSURANCE | Attending: Emergency Medicine | Admitting: Emergency Medicine

## 2014-06-30 DIAGNOSIS — Z8709 Personal history of other diseases of the respiratory system: Secondary | ICD-10-CM | POA: Diagnosis not present

## 2014-06-30 DIAGNOSIS — Z720 Tobacco use: Secondary | ICD-10-CM | POA: Diagnosis not present

## 2014-06-30 DIAGNOSIS — A084 Viral intestinal infection, unspecified: Secondary | ICD-10-CM | POA: Diagnosis not present

## 2014-06-30 DIAGNOSIS — R1011 Right upper quadrant pain: Secondary | ICD-10-CM

## 2014-06-30 DIAGNOSIS — R197 Diarrhea, unspecified: Secondary | ICD-10-CM | POA: Diagnosis present

## 2014-06-30 MED ORDER — SODIUM CHLORIDE 0.9 % IV BOLUS (SEPSIS)
1000.0000 mL | Freq: Once | INTRAVENOUS | Status: AC
  Administered 2014-06-30: 1000 mL via INTRAVENOUS

## 2014-06-30 MED ORDER — RANITIDINE HCL 150 MG/10ML PO SYRP
150.0000 mg | ORAL_SOLUTION | Freq: Once | ORAL | Status: AC
  Administered 2014-06-30: 150 mg via ORAL
  Filled 2014-06-30: qty 10

## 2014-06-30 MED ORDER — ONDANSETRON HCL 4 MG/2ML IJ SOLN
4.0000 mg | Freq: Once | INTRAMUSCULAR | Status: AC
  Administered 2014-06-30: 4 mg via INTRAVENOUS
  Filled 2014-06-30: qty 2

## 2014-06-30 NOTE — ED Notes (Signed)
Pt states he has had vomiting and diarrhea since 0930 this morning   Pt states now he is starting to have muscle cramps

## 2014-06-30 NOTE — ED Provider Notes (Signed)
CSN: [id number]     Arrival date & time 06/30/14  2142 History   First MD Initiated Contact with Patient 06/30/14 2257     Chief Complaint  Patient presents with  . Emesis  . Diarrhea     (Consider location/radiation/quality/duration/timing/severity/associated sxs/prior Treatment) HPI  Dustin Watkins is a 28 y.o. male with no significant past medical history coming in with nausea vomiting and diarrhea since 9:30 this morning. He has not had anything to eat today and states that his meal was normal last night. He has no other sick contacts with these symptoms. He denies any blood. He does have abdominal cramping diffusely. He denies any fevers or history of this in the past. Patient states nothing makes his symptoms better or worse throughout the day. He is currently denying abdominal pain in the room, he states that he is still nauseated.  10 Systems reviewed and are negative for acute change except as noted in the HPI.    Past Medical History  Diagnosis Date  . Bronchitis    History reviewed. No pertinent past surgical history. Family History  Problem Relation Age of Onset  . Hypertension Other   . Diabetes Other    History  Substance Use Topics  . Smoking status: Current Every Day Smoker -- 0.20 packs/day    Types: Cigarettes  . Smokeless tobacco: Not on file  . Alcohol Use: Yes     Comment: occ    Review of Systems    Allergies  Review of patient's allergies indicates no known allergies.  Home Medications   Prior to Admission medications   Medication Sig Start Date End Date Taking? Authorizing Provider  ibuprofen (ADVIL,MOTRIN) 600 MG tablet Take 1 tablet (600 mg total) by mouth every 6 (six) hours as needed. 05/02/14   Shon Batonourtney F Horton, MD   BP 130/70 mmHg  Pulse 103  Temp(Src) 98.7 F (37.1 C) (Oral)  Resp 14  SpO2 97% Physical Exam  Constitutional: He is oriented to person, place, and time. Vital signs are normal. He appears well-developed and  well-nourished.  Non-toxic appearance. He does not appear ill. No distress.  HENT:  Head: Normocephalic and atraumatic.  Nose: Nose normal.  Mouth/Throat: Oropharynx is clear and moist. No oropharyngeal exudate.  Eyes: Conjunctivae and EOM are normal. Pupils are equal, round, and reactive to light. No scleral icterus.  Neck: Normal range of motion. Neck supple. No tracheal deviation, no edema, no erythema and normal range of motion present. No thyroid mass and no thyromegaly present.  Cardiovascular: Normal rate, regular rhythm, S1 normal, S2 normal, normal heart sounds, intact distal pulses and normal pulses.  Exam reveals no gallop and no friction rub.   No murmur heard. Pulses:      Radial pulses are 2+ on the right side, and 2+ on the left side.       Dorsalis pedis pulses are 2+ on the right side, and 2+ on the left side.  Pulmonary/Chest: Effort normal and breath sounds normal. No respiratory distress. He has no wheezes. He has no rhonchi. He has no rales.  Abdominal: Soft. Normal appearance and bowel sounds are normal. He exhibits no distension, no ascites and no mass. There is no hepatosplenomegaly. There is no tenderness. There is no rebound, no guarding and no CVA tenderness.  Musculoskeletal: Normal range of motion. He exhibits no edema or tenderness.  Lymphadenopathy:    He has no cervical adenopathy.  Neurological: He is alert and oriented to person, place, and  time. He has normal strength. No cranial nerve deficit or sensory deficit. GCS eye subscore is 4. GCS verbal subscore is 5. GCS motor subscore is 6.  Skin: Skin is warm, dry and intact. No petechiae and no rash noted. He is not diaphoretic. No erythema. No pallor.  Psychiatric: He has a normal mood and affect. His behavior is normal. Judgment normal.  Nursing note and vitals reviewed.   ED Course  Procedures (including critical care time) Labs Review Labs Reviewed  CBC WITH DIFFERENTIAL - Abnormal; Notable for the  following:    Neutrophils Relative % 88 (*)    Lymphocytes Relative 7 (*)    Lymphs Abs 0.6 (*)    All other components within normal limits  COMPREHENSIVE METABOLIC PANEL - Abnormal; Notable for the following:    Potassium 3.6 (*)    Glucose, Bld 119 (*)    Total Bilirubin 2.1 (*)    Anion gap 16 (*)    All other components within normal limits  URINALYSIS, ROUTINE W REFLEX MICROSCOPIC - Abnormal; Notable for the following:    Color, Urine AMBER (*)    Specific Gravity, Urine 1.037 (*)    Glucose, UA 250 (*)    Bilirubin Urine SMALL (*)    Ketones, ur 15 (*)    All other components within normal limits  URINE CULTURE  LIPASE, BLOOD    Imaging Review [place] Abdomen Limited  07/01/2014   CLINICAL DATA:  Right upper quadrant pain  EXAM: US ABDOMEN LIMITED - RIGHT UPPER QUADRANT  COMPARISON:  None.  FINDINGS: Gallbladder:  No evidence of gallstone. There is partial gallbladder contraction without wall thickening or focal tenderness.  Common bile duct:  Diameter: 4 mm  Liver:  No focal lesion identified. Within normal limits in parenchymal echogenicity.  IMPRESSION: Negative right upper quadrant ultrasound.   Electronically Signed   By: Tiburcio PeaJonathan  Watts M.D.   On: 07/01/2014 02:41     EKG Interpretation None      MDM   Final diagnoses:  None    Patient presents to the emergency department out of concern for abdominal cramping, nausea vomiting and diarrhea. This is likely a gastroenteritis. We will obtain laboratory studies, he was given Zofran and ranitidine for treatment. Also given 1 L of fluid due to his tachycardia on triage vitals.  Upon repeat assessment patient's symptoms have resolved. Ultrasound was obtained due to elevated bilirubin level, it is negative for any right upper quadrant pathology. Patient was advised on dietary changes, he'll be discharged with Zofran and ranitidine, primary care follow-up was advised, his vital signs were within his normal limits and he is safe  for discharge.    Tomasita CrumbleAdeleke Artelia Game, MD 07/01/14 [phone]

## 2014-07-01 ENCOUNTER — Emergency Department (HOSPITAL_COMMUNITY): Payer: PRIVATE HEALTH INSURANCE

## 2014-07-01 LAB — CBC WITH DIFFERENTIAL/PLATELET
Basophils Absolute: 0 10*3/uL (ref 0.0–0.1)
Basophils Relative: 0 % (ref 0–1)
Eosinophils Absolute: 0 10*3/uL (ref 0.0–0.7)
Eosinophils Relative: 0 % (ref 0–5)
HEMATOCRIT: 46.9 % (ref 39.0–52.0)
HEMOGLOBIN: 16.2 g/dL (ref 13.0–17.0)
LYMPHS PCT: 7 % — AB (ref 12–46)
Lymphs Abs: 0.6 10*3/uL — ABNORMAL LOW (ref 0.7–4.0)
MCH: 30.9 pg (ref 26.0–34.0)
MCHC: 34.5 g/dL (ref 30.0–36.0)
MCV: 89.3 fL (ref 78.0–100.0)
MONOS PCT: 5 % (ref 3–12)
Monocytes Absolute: 0.4 10*3/uL (ref 0.1–1.0)
NEUTROS ABS: 7.2 10*3/uL (ref 1.7–7.7)
Neutrophils Relative %: 88 % — ABNORMAL HIGH (ref 43–77)
Platelets: 151 10*3/uL (ref 150–400)
RBC: 5.25 MIL/uL (ref 4.22–5.81)
RDW: 12.8 % (ref 11.5–15.5)
WBC: 8.2 10*3/uL (ref 4.0–10.5)

## 2014-07-01 LAB — URINALYSIS, ROUTINE W REFLEX MICROSCOPIC
GLUCOSE, UA: 250 mg/dL — AB
Hgb urine dipstick: NEGATIVE
KETONES UR: 15 mg/dL — AB
LEUKOCYTES UA: NEGATIVE
Nitrite: NEGATIVE
Protein, ur: NEGATIVE mg/dL
Specific Gravity, Urine: 1.037 — ABNORMAL HIGH (ref 1.005–1.030)
Urobilinogen, UA: 1 mg/dL (ref 0.0–1.0)
pH: 6 (ref 5.0–8.0)

## 2014-07-01 LAB — LIPASE, BLOOD: Lipase: 15 U/L (ref 11–59)

## 2014-07-01 LAB — COMPREHENSIVE METABOLIC PANEL
ALK PHOS: 66 U/L (ref 39–117)
ALT: 48 U/L (ref 0–53)
ANION GAP: 16 — AB (ref 5–15)
AST: 32 U/L (ref 0–37)
Albumin: 3.8 g/dL (ref 3.5–5.2)
BILIRUBIN TOTAL: 2.1 mg/dL — AB (ref 0.3–1.2)
BUN: 17 mg/dL (ref 6–23)
CHLORIDE: 101 meq/L (ref 96–112)
CO2: 22 meq/L (ref 19–32)
Calcium: 9.4 mg/dL (ref 8.4–10.5)
Creatinine, Ser: 0.91 mg/dL (ref 0.50–1.35)
GFR calc Af Amer: >90 mL/min (ref >90)
GLUCOSE: 119 mg/dL — AB (ref 70–99)
POTASSIUM: 3.6 meq/L — AB (ref 3.7–5.3)
Sodium: 139 mEq/L (ref 137–147)
Total Protein: 6.9 g/dL (ref 6.0–8.3)

## 2014-07-01 MED ORDER — RANITIDINE HCL 150 MG/10ML PO SYRP: 150.0000 mg | ORAL_SOLUTION | Freq: Three times a day (TID) | ORAL | Status: DC | PRN

## 2014-07-01 MED ORDER — ONDANSETRON HCL 4 MG PO TABS: 4.0000 mg | ORAL_TABLET | Freq: Three times a day (TID) | ORAL | Status: DC | PRN

## 2014-07-01 NOTE — ED Notes (Signed)
US at bedside

## 2014-07-01 NOTE — Discharge Instructions (Signed)
Viral Gastroenteritis Mr. Dustin Watkins, you were seen today for vomiting and diarrhea. Your laboratory studies were normal.  Follow up with a primary care physician within 3 days for continued management. If any symptoms worsen come back to emergency department for repeat evaluation. Thank you. Viral gastroenteritis is also called stomach flu. This illness is caused by a certain type of germ (virus). It can cause sudden watery poop (diarrhea) and throwing up (vomiting). This can cause you to lose body fluids (dehydration). This illness usually lasts for 3 to 8 days. It usually goes away on its own. HOME CARE   Drink enough fluids to keep your pee (urine) clear or pale yellow. Drink small amounts of fluids often.  Ask your doctor how to replace body fluid losses (rehydration).  Avoid:  Foods high in sugar.  Alcohol.  Bubbly (carbonated) drinks.  Tobacco.  Juice.  Caffeine drinks.  Very hot or cold fluids.  Fatty, greasy foods.  Eating too much at one time.  Dairy products until 24 to 48 hours after your watery poop stops.  You may eat foods with active cultures (probiotics). They can be found in some yogurts and supplements.  Wash your hands well to avoid spreading the illness.  Only take medicines as told by your doctor. Do not give aspirin to children. Do not take medicines for watery poop (antidiarrheals).  Ask your doctor if you should keep taking your regular medicines.  Keep all doctor visits as told. GET HELP RIGHT AWAY IF:   You cannot keep fluids down.  You do not pee at least once every 6 to 8 hours.  You are short of breath.  You see blood in your poop or throw up. This may look like coffee grounds.  You have belly (abdominal) pain that gets worse or is just in one small spot (localized).  You keep throwing up or having watery poop.  You have a fever.  The patient is a child younger than 3 months, and he or she has a fever.  The patient is a child older  than 3 months, and he or she has a fever and problems that do not go away.  The patient is a child older than 3 months, and he or she has a fever and problems that suddenly get worse.  The patient is a baby, and he or she has no tears when crying. MAKE SURE YOU:   Understand these instructions.  Will watch your condition.  Will get help right away if you are not doing well or get worse. Document Released: 12/18/2007 Document Revised: 09/23/2011 Document Reviewed: 04/17/2011 [facility] Patient Information 2015 [place], [place]. This information is not intended to replace advice given to you by your health care provider. Make sure you discuss any questions you have with your health care provider.

## 2014-07-02 LAB — URINE CULTURE
Colony Count: NO GROWTH
Culture: NO GROWTH

## 2014-10-11 ENCOUNTER — Encounter (HOSPITAL_COMMUNITY): Payer: Self-pay | Admitting: *Deleted

## 2014-10-11 ENCOUNTER — Emergency Department (HOSPITAL_COMMUNITY)
Admission: EM | Admit: 2014-10-11 | Discharge: 2014-10-11 | Disposition: A | Discharge status: Home / Self Care | Payer: PRIVATE HEALTH INSURANCE | Attending: Emergency Medicine | Admitting: Emergency Medicine

## 2014-10-11 DIAGNOSIS — Y9289 Other specified places as the place of occurrence of the external cause: Secondary | ICD-10-CM | POA: Diagnosis not present

## 2014-10-11 DIAGNOSIS — X58XXXA Exposure to other specified factors, initial encounter: Secondary | ICD-10-CM | POA: Insufficient documentation

## 2014-10-11 DIAGNOSIS — Y998 Other external cause status: Secondary | ICD-10-CM | POA: Insufficient documentation

## 2014-10-11 DIAGNOSIS — S3992XA Unspecified injury of lower back, initial encounter: Secondary | ICD-10-CM | POA: Diagnosis present

## 2014-10-11 DIAGNOSIS — Z720 Tobacco use: Secondary | ICD-10-CM | POA: Diagnosis not present

## 2014-10-11 DIAGNOSIS — Z8709 Personal history of other diseases of the respiratory system: Secondary | ICD-10-CM | POA: Insufficient documentation

## 2014-10-11 DIAGNOSIS — Y9389 Activity, other specified: Secondary | ICD-10-CM | POA: Insufficient documentation

## 2014-10-11 DIAGNOSIS — S39012A Strain of muscle, fascia and tendon of lower back, initial encounter: Secondary | ICD-10-CM | POA: Diagnosis not present

## 2014-10-11 MED ORDER — NAPROXEN 500 MG PO TABS: 500.0000 mg | ORAL_TABLET | Freq: Two times a day (BID) | ORAL | Status: DC

## 2014-10-11 MED ORDER — METHOCARBAMOL 500 MG PO TABS: 500.0000 mg | ORAL_TABLET | Freq: Three times a day (TID) | ORAL | Status: DC | PRN

## 2014-10-11 NOTE — ED Provider Notes (Signed)
CSN: [id number]     Arrival date & time 10/11/14  1450 History  This chart was scribed for non-physician practitioner, Dustin Skeenobyn Victormanuel Mclure, PA-C working with Dustin HongBrian Miller, MD by Dustin Watkins, ED scribe. This patient was seen in room WTR6/WTR6 and the patient's care was started at 3:50 PM.   Chief Complaint  Patient presents with  . Back Pain   The history is provided by the patient. No language interpreter was used.    HPI Comments: Dustin Watkins is a 29 y.o. male who presents to the Emergency Department complaining of lower back pain that started 3 days ago. Pain worsened the following day. It does not radiate. He states has had similar intermittent back pain over the last month due to heavy lifting. Pt has taken a muscle relaxer with some relief. Laying down and leaning over relieves some pain. Standing up worsens pain. Pt denies fever, chills, night sweats, bowel or bladder incontinence.   Past Medical History  Diagnosis Date  . Bronchitis    History reviewed. No pertinent past surgical history. Family History  Problem Relation Age of Onset  . Hypertension Other   . Diabetes Other    History  Substance Use Topics  . Smoking status: Current Every Day Smoker -- 0.20 packs/day    Types: Cigarettes  . Smokeless tobacco: Not on file  . Alcohol Use: Yes     Comment: occ    Review of Systems  Constitutional: Negative for fever and chills.  Genitourinary:       Negative for bowel or bladder incontinence.  Musculoskeletal: Positive for back pain.  All other systems reviewed and are negative.  Allergies  Review of patient's allergies indicates no known allergies.  Home Medications   Prior to Admission medications   Medication Sig Start Date End Date Taking? Authorizing Provider  ibuprofen (ADVIL,MOTRIN) 600 MG tablet Take 1 tablet (600 mg total) by mouth every 6 (six) hours as needed. 05/02/14   Dustin Batonourtney F Horton, MD  methocarbamol (ROBAXIN) 500 MG tablet Take 1 tablet (500 mg total)  by mouth every 8 (eight) hours as needed for muscle spasms. 10/11/14   Dustin Goodpasture M Makari Portman, PA-C  naproxen (NAPROSYN) 500 MG tablet Take 1 tablet (500 mg total) by mouth 2 (two) times daily. 10/11/14   Dustin Seebeck M Donyae Kilner, PA-C  ondansetron (ZOFRAN) 4 MG tablet Take 1 tablet (4 mg total) by mouth every 8 (eight) hours as needed for nausea or vomiting. 07/01/14   Dustin CrumbleAdeleke Oni, MD  ranitidine (ZANTAC) 150 MG/10ML syrup Take 10 mLs (150 mg total) by mouth 3 (three) times daily as needed for heartburn. 07/01/14   Dustin CrumbleAdeleke Oni, MD   BP 128/73 mmHg  Pulse 79  Temp(Src) 98.4 F (36.9 C) (Oral)  Resp 16  SpO2 98%   Physical Exam  Constitutional: He is oriented to person, place, and time. He appears well-developed and well-nourished. No distress.  HENT:  Head: Normocephalic and atraumatic.  Mouth/Throat: Oropharynx is clear and moist.  Eyes: Conjunctivae are normal.  Neck: Normal range of motion. Neck supple. No spinous process tenderness and no muscular tenderness present.  Cardiovascular: Normal rate, regular rhythm and normal heart sounds.   Pulmonary/Chest: Effort normal and breath sounds normal. No respiratory distress.  Musculoskeletal: He exhibits no edema.  Tenderness to palpation in bilateral paralumbar spinal muscles. No edema or step off. Full ROM.   Neurological: He is alert and oriented to person, place, and time. He has normal strength.  Strength lower extremities 5/5 and  equal bilateral. Sensation intact. Normal gait.  Skin: Skin is warm and dry. No rash noted. He is not diaphoretic.  Psychiatric: He has a normal mood and affect. His behavior is normal.  Nursing note and vitals reviewed.   ED Course  Procedures (including critical care time)  DIAGNOSTIC STUDIES: Oxygen Saturation is 98% on RA, normal by my interpretation.    COORDINATION OF CARE: 3:53 PM-Discussed treatment plan which includes a muscle relaxer and an anti-inflammatory with pt at bedside and pt agreed to plan.   Labs  Review Labs Reviewed - No data to display  Imaging Review No results found.   EKG Interpretation None      MDM   Final diagnoses:  Lumbar strain, initial encounter   NAD. AFVSS. No red flags concerning patient's back pain. No s/s of central cord compression or cauda equina. Lower extremities are neurovascularly intact and patient is ambulating without difficulty. Will discharge home with Rx naproxen and Robaxin. Follow-up with PCP. Stable for discharge. Return precautions given. Patient states understanding of treatment care plan and is agreeable.  I personally performed the services described in this documentation, which was scribed in my presence. The recorded information has been reviewed and is accurate.}  Dustin Speed, PA-C 10/11/14 1604  Dustin Hong, MD 10/11/14 [phone]

## 2014-10-11 NOTE — ED Notes (Signed)
Pt reports lower back pain since Saturday, reports pain got worse on Sunday. Pt ambulatory. Pain 8/10 at present.

## 2014-10-11 NOTE — Discharge Instructions (Signed)
No driving or operating heavy machinery while taking robaxin. This medication may make you drowsy. Take naproxen as prescribed. Rest, avoid heavy lifting or hard physical activity for the next few days. Apply heating pad to your back.  Lumbosacral Strain Lumbosacral strain is a strain of any of the parts that make up your lumbosacral vertebrae. Your lumbosacral vertebrae are the bones that make up the lower third of your backbone. Your lumbosacral vertebrae are held together by muscles and tough, fibrous tissue (ligaments).  CAUSES  A sudden blow to your back can cause lumbosacral strain. Also, anything that causes an excessive stretch of the muscles in the low back can cause this strain. This is typically seen when people exert themselves strenuously, fall, lift heavy objects, bend, or crouch repeatedly. RISK FACTORS  Physically demanding work.  Participation in pushing or pulling sports or sports that require a sudden twist of the back (tennis, golf, baseball).  Weight lifting.  Excessive lower back curvature.  Forward-tilted pelvis.  Weak back or abdominal muscles or both.  Tight hamstrings. SIGNS AND SYMPTOMS  Lumbosacral strain may cause pain in the area of your injury or pain that moves (radiates) down your leg.  DIAGNOSIS Your health care provider can often diagnose lumbosacral strain through a physical exam. In some cases, you may need tests such as X-ray exams.  TREATMENT  Treatment for your lower back injury depends on many factors that your clinician will have to evaluate. However, most treatment will include the use of anti-inflammatory medicines. HOME CARE INSTRUCTIONS   Avoid hard physical activities (tennis, racquetball, waterskiing) if you are not in proper physical condition for it. This may aggravate or create problems.  If you have a back problem, avoid sports requiring sudden body movements. Swimming and walking are generally safer activities.  Maintain good  posture.  Maintain a healthy weight.  For acute conditions, you may put ice on the injured area.  Put ice in a plastic bag.  Place a towel between your skin and the bag.  Leave the ice on for 20 minutes, 2-3 times a day.  When the low back starts healing, stretching and strengthening exercises may be recommended. SEEK MEDICAL CARE IF:  Your back pain is getting worse.  You experience severe back pain not relieved with medicines. SEEK IMMEDIATE MEDICAL CARE IF:   You have numbness, tingling, weakness, or problems with the use of your arms or legs.  There is a change in bowel or bladder control.  You have increasing pain in any area of the body, including your belly (abdomen).  You notice shortness of breath, dizziness, or feel faint.  You feel sick to your stomach (nauseous), are throwing up (vomiting), or become sweaty.  You notice discoloration of your toes or legs, or your feet get very cold. MAKE SURE YOU:   Understand these instructions.  Will watch your condition.  Will get help right away if you are not doing well or get worse. Document Released: 04/10/2005 Document Revised: 07/06/2013 Document Reviewed: 02/17/2013 [facility] Patient Information 2015 [employer] [place]. This information is not intended to replace advice given to you by your health care provider. Make sure you discuss any questions you have with your health care provider.

## 2015-08-11 IMAGING — CR DG FOREARM 2V*L*
2 series · 2 of 2 positions shown · non-contrast
Comparison: None.

CLINICAL DATA: Pain

EXAM:
LEFT FOREARM - 2 VIEW

[x forearm ap left]
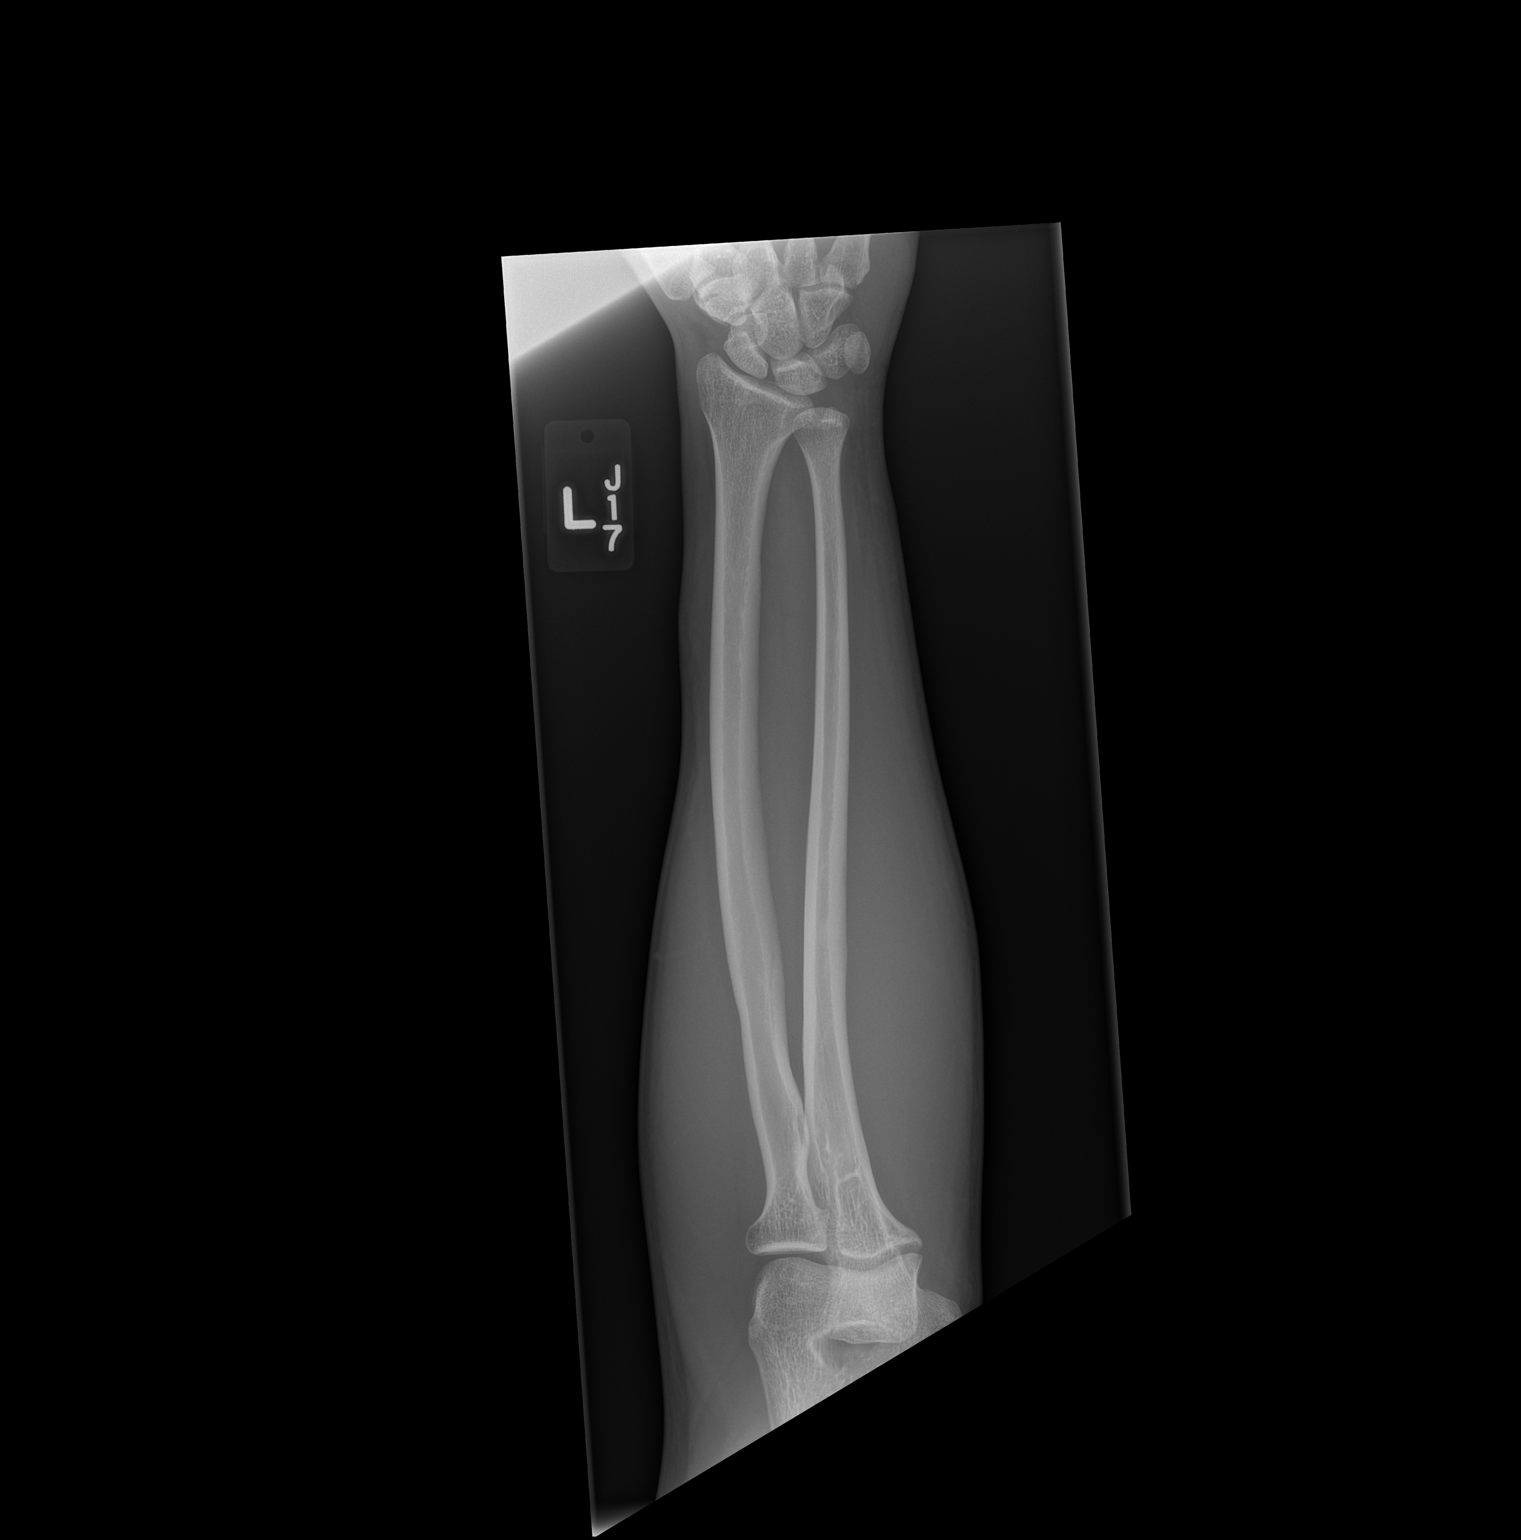

[x forearm lat left]
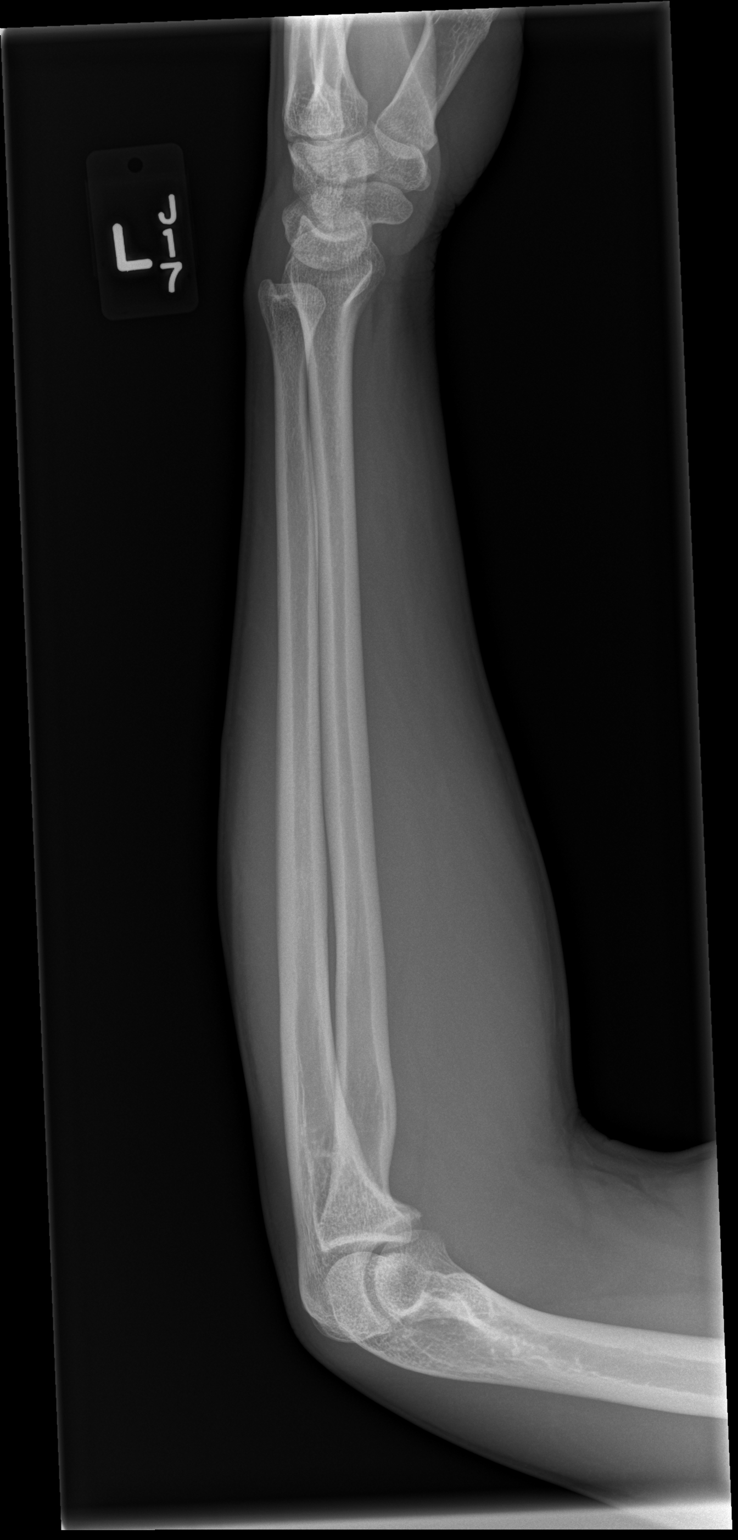

[2 of 2 positions shown; findings below may reference images not displayed]

FINDINGS: Frontal and lateral views were obtained. No fracture or dislocation.
No abnormal periosteal reaction. Joint spaces appear intact.
Incidental note is made of a minus ulnar variant.
IMPRESSION: No fracture or dislocation.

## 2016-01-08 ENCOUNTER — Emergency Department (HOSPITAL_COMMUNITY)
Admission: EM | Admit: 2016-01-08 | Discharge: 2016-01-08 | Disposition: A | Discharge status: Home / Self Care | Payer: PRIVATE HEALTH INSURANCE | Attending: Emergency Medicine | Admitting: Emergency Medicine

## 2016-01-08 ENCOUNTER — Encounter (HOSPITAL_COMMUNITY): Payer: Self-pay | Admitting: *Deleted

## 2016-01-08 DIAGNOSIS — Z203 Contact with and (suspected) exposure to rabies: Secondary | ICD-10-CM | POA: Insufficient documentation

## 2016-01-08 DIAGNOSIS — F17210 Nicotine dependence, cigarettes, uncomplicated: Secondary | ICD-10-CM | POA: Insufficient documentation

## 2016-01-08 DIAGNOSIS — Z23 Encounter for immunization: Secondary | ICD-10-CM

## 2016-01-08 MED ORDER — RABIES VACCINE, PCEC IM SUSR
1.0000 mL | Freq: Once | INTRAMUSCULAR | Status: AC
  Administered 2016-01-08: 1 mL via INTRAMUSCULAR
  Filled 2016-01-08 (×2): qty 1

## 2016-01-08 MED ORDER — TETANUS-DIPHTH-ACELL PERTUSSIS 5-2.5-18.5 LF-MCG/0.5 IM SUSP
0.5000 mL | Freq: Once | INTRAMUSCULAR | Status: AC
  Administered 2016-01-08: 0.5 mL via INTRAMUSCULAR
  Filled 2016-01-08: qty 0.5

## 2016-01-08 MED ORDER — RABIES IMMUNE GLOBULIN 150 UNIT/ML IM INJ
20.0000 [IU]/kg | INJECTION | Freq: Once | INTRAMUSCULAR | Status: AC
  Administered 2016-01-08: 1725 [IU] via INTRAMUSCULAR
  Filled 2016-01-08 (×2): qty 11.5

## 2016-01-08 NOTE — ED Notes (Signed)
Jenne PaneBates were first found in [address].  Pt has been instructed by health dept to come to the ED for rabies injections.

## 2016-01-08 NOTE — ED Notes (Signed)
Declined W/C at D/C and was escorted to lobby by RN. 

## 2016-01-08 NOTE — ED Provider Notes (Signed)
CSN: [id number]     Arrival date & time 01/08/16  1618 History  By signing my name below, I, Rosario AdieWilliam Andrew Hiatt, attest that this documentation has been prepared under the direction and in the presence of [facility] HospitalJaime Ronesha Heenan, PA-C.   Electronically Signed: Rosario AdieWilliam Andrew Hiatt, ED Scribe. 01/08/2016. 5:15 PM.   Chief Complaint  Patient presents with  . Animal Bite   The history is provided by the patient. No language interpreter was used.   HPI Comments: Dustin Watkins is a 30 y.o. male who presents to the Emergency Department for a rabies vaccination. Pt reports that he was instructed by the health department to come to the ED for the vaccination because they have a bat infestation in their apartment. He notes that that have found over four bats in their apartment in this past week, and noticed the first one approximately 9 days PTA. Pt states that the landlord has not removed the bats from their home yet. He has not received any previous vaccinations for Rabies in the past. He denies fever, cough, or any other known symptoms. Tetanus is not UTD.   Past Medical History  Diagnosis Date  . Bronchitis    History reviewed. No pertinent past surgical history. Family History  Problem Relation Age of Onset  . Hypertension Other   . Diabetes Other    Social History  Substance Use Topics  . Smoking status: Current Every Day Smoker -- 0.20 packs/day    Types: Cigarettes  . Smokeless tobacco: None  . Alcohol Use: Yes     Comment: occ    Review of Systems  Constitutional: Negative for fever.  Respiratory: Negative for cough.    Allergies  Review of patient's allergies indicates no known allergies.  Home Medications   Prior to Admission medications   Medication Sig Start Date End Date Taking? Authorizing Provider  ibuprofen (ADVIL,MOTRIN) 600 MG tablet Take 1 tablet (600 mg total) by mouth every 6 (six) hours as needed. 05/02/14   Shon Batonourtney F Horton, MD  methocarbamol (ROBAXIN) 500 MG tablet  Take 1 tablet (500 mg total) by mouth every 8 (eight) hours as needed for muscle spasms. 10/11/14   Robyn M Hess, PA-C  naproxen (NAPROSYN) 500 MG tablet Take 1 tablet (500 mg total) by mouth 2 (two) times daily. 10/11/14   Robyn M Hess, PA-C  ondansetron (ZOFRAN) 4 MG tablet Take 1 tablet (4 mg total) by mouth every 8 (eight) hours as needed for nausea or vomiting. 07/01/14   Tomasita CrumbleAdeleke Oni, MD  ranitidine (ZANTAC) 150 MG/10ML syrup Take 10 mLs (150 mg total) by mouth 3 (three) times daily as needed for heartburn. 07/01/14   Tomasita CrumbleAdeleke Oni, MD   BP 128/79 mmHg  Pulse 72  Temp(Src) 98.1 F (36.7 C) (Oral)  Resp 16  Ht 5\' 7"  (1.702 m)  Wt 85.276 kg  BMI 29.44 kg/m2  SpO2 100%   Physical Exam  Constitutional: He appears well-developed and well-nourished.  HENT:  Head: Normocephalic and atraumatic.  Cardiovascular: Normal rate, regular rhythm and normal heart sounds.   No murmur heard. Pulmonary/Chest: Effort normal and breath sounds normal. No respiratory distress.  Musculoskeletal: Normal range of motion.  Neurological: He is alert.  Skin: Skin is warm and dry.  Psychiatric: He has a normal mood and affect. His behavior is normal.  Nursing note and vitals reviewed.  ED Course  Procedures (including critical care time)  DIAGNOSTIC STUDIES: Oxygen Saturation is 100% on RA, normal by my interpretation.  COORDINATION OF CARE: 5:14 PM-Discussed next steps with pt including Rabies vaccination. Pt verbalized understanding and is agreeable with the plan.   MDM   Final diagnoses:  Need for rabies vaccination   Dustin Watkins presents to ED for rabies vaccine. Bats were found in their apartment. No one has been bitten. Patient is asymptomatic with no complaints at this time. Rabies vaccine given. Tetanus updates. [facility] of rabies vaccines. All questions answered.  I personally performed the services described in this  documentation, which was scribed in my presence. The recorded information has been reviewed and is accurate.   Chase PicketJaime Pilcher Anzlee Hinesley, PA-C 01/08/16 1831  Melene Planan Floyd, DO 01/08/16 1947

## 2016-02-20 ENCOUNTER — Encounter (HOSPITAL_COMMUNITY): Payer: Self-pay | Admitting: Emergency Medicine

## 2016-02-20 ENCOUNTER — Emergency Department (HOSPITAL_COMMUNITY)
Admission: EM | Admit: 2016-02-20 | Discharge: 2016-02-20 | Disposition: A | Discharge status: Home / Self Care | Payer: PRIVATE HEALTH INSURANCE | Attending: Emergency Medicine | Admitting: Emergency Medicine

## 2016-02-20 DIAGNOSIS — M545 Low back pain, unspecified: Secondary | ICD-10-CM

## 2016-02-20 DIAGNOSIS — Z79891 Long term (current) use of opiate analgesic: Secondary | ICD-10-CM | POA: Insufficient documentation

## 2016-02-20 DIAGNOSIS — R200 Anesthesia of skin: Secondary | ICD-10-CM | POA: Insufficient documentation

## 2016-02-20 DIAGNOSIS — F17210 Nicotine dependence, cigarettes, uncomplicated: Secondary | ICD-10-CM | POA: Insufficient documentation

## 2016-02-20 DIAGNOSIS — Z79899 Other long term (current) drug therapy: Secondary | ICD-10-CM | POA: Insufficient documentation

## 2016-02-20 DIAGNOSIS — M542 Cervicalgia: Secondary | ICD-10-CM

## 2016-02-20 LAB — I-STAT CHEM 8, ED
BUN: 19 mg/dL (ref 6–20)
CHLORIDE: 103 mmol/L (ref 101–111)
Calcium, Ion: 1.17 mmol/L (ref 1.13–1.30)
Creatinine, Ser: 1 mg/dL (ref 0.61–1.24)
GLUCOSE: 103 mg/dL — AB (ref 65–99)
HCT: 45 % (ref 39.0–52.0)
Hemoglobin: 15.3 g/dL (ref 13.0–17.0)
POTASSIUM: 3.6 mmol/L (ref 3.5–5.1)
SODIUM: 142 mmol/L (ref 135–145)
TCO2: 25 mmol/L (ref 0–100)

## 2016-02-20 MED ORDER — KETOROLAC TROMETHAMINE 30 MG/ML IJ SOLN
30.0000 mg | Freq: Once | INTRAMUSCULAR | Status: AC
  Administered 2016-02-20: 30 mg via INTRAMUSCULAR
  Filled 2016-02-20: qty 1

## 2016-02-20 MED ORDER — IBUPROFEN 800 MG PO TABS: 800.0000 mg | ORAL_TABLET | Freq: Three times a day (TID) | ORAL | 0 refills | Status: AC

## 2016-02-20 NOTE — ED Triage Notes (Signed)
Patient complains of neck and back pain. Patient states that it has been hurting for three weeks. Patient does not know how it happened.

## 2016-02-20 NOTE — ED Provider Notes (Signed)
[facility]-EMERGENCY DEPT Provider Note   CSN: [id number] Arrival date & time: 02/20/16  9629  First Provider Contact:  First MD Initiated Contact with Patient 02/20/16 0730        History   Chief Complaint Chief Complaint  Patient presents with  . Neck Pain  . Back Pain    HPI Dustin Watkins is a 30 y.o. male.  HPI Patient presents with concern of ongoing back, neck pain and new dysesthesia in the right upper extremity. He notes that for about the past 3 weeks he has had pain throughout the lumbar and cervical spine regions, this has been sore, moderate, not improved with multiple different OTC medications. In the past few hours, receiving a massage he developed numbness and tingling in the right distal upper extremity. No weakness, and this symptom resolved after cessation of the massage. Currently continues to complain of pain in the back, but denies other new concerns including fever, chills, cough, abdominal pain, chest pain, incontinence, weakness or falling.  Past Medical History:  Diagnosis Date  . Bronchitis     Patient Active Problem List   Diagnosis Date Noted  . Viral gastroenteritis 09/23/2013  . Hyperglycemia 03/01/2013  . Prediabetes 03/01/2013    History reviewed. No pertinent surgical history.     Home Medications    Prior to Admission medications   Medication Sig Start Date End Date Taking? Authorizing Provider  ibuprofen (ADVIL,MOTRIN) 600 MG tablet Take 1 tablet (600 mg total) by mouth every 6 (six) hours as needed. 05/02/14   Shon Baton, MD  methocarbamol (ROBAXIN) 500 MG tablet Take 1 tablet (500 mg total) by mouth every 8 (eight) hours as needed for muscle spasms. 10/11/14   Robyn M Hess, PA-C  naproxen (NAPROSYN) 500 MG tablet Take 1 tablet (500 mg total) by mouth 2 (two) times daily. 10/11/14   Robyn M Hess, PA-C  ondansetron (ZOFRAN) 4 MG tablet Take 1 tablet (4 mg total) by mouth every 8 (eight) hours as needed for nausea or vomiting.  07/01/14   Tomasita Crumble, MD  ranitidine (ZANTAC) 150 MG/10ML syrup Take 10 mLs (150 mg total) by mouth 3 (three) times daily as needed for heartburn. 07/01/14   Tomasita Crumble, MD    Family History Family History  Problem Relation Age of Onset  . Hypertension Other   . Diabetes Other     Social History Social History  Substance Use Topics  . Smoking status: Current Every Day Smoker    Packs/day: 0.20    Types: Cigarettes  . Smokeless tobacco: Never Used  . Alcohol use Yes     Comment: occ     Allergies   Review of patient's allergies indicates no known allergies.   Review of Systems Review of Systems  Constitutional:       Per HPI, otherwise negative  HENT:       Per HPI, otherwise negative  Respiratory:       Per HPI, otherwise negative  Cardiovascular:       Per HPI, otherwise negative  Gastrointestinal: Negative for vomiting.  Endocrine:       Negative aside from HPI  Genitourinary:       Neg aside from HPI   Musculoskeletal:       Per HPI, otherwise negative  Skin: Negative.   Neurological: Positive for numbness. Negative for syncope.     Physical Exam Updated Vital Signs BP 137/96 (BP Location: Right Arm)   Pulse 84   Temp 98.7  F (37.1 C)   Resp 18   Ht 5\' 8"  (1.727 m)   Wt 175 lb (79.4 kg)   SpO2 99%   BMI 26.61 kg/m   Physical Exam  Constitutional: He is oriented to person, place, and time. He appears well-developed. No distress.  HENT:  Head: Normocephalic and atraumatic.  Eyes: Conjunctivae and EOM are normal.  Neck:  See musculoskeletal exam, otherwise range of motion normal, supple, no midline tenderness  Cardiovascular: Normal rate and regular rhythm.   Pulmonary/Chest: Effort normal. No stridor. No respiratory distress.  Abdominal: He exhibits no distension.  Musculoskeletal: He exhibits no edema.  Diffuse tenderness to palpation in the paraspinal regions cervical and lumbar, with reproducible dysesthesia in the right upper extremity  with point palpation of the right superior trapezius region  Neurological: He is alert and oriented to person, place, and time. He displays no atrophy and no tremor. No cranial nerve deficit or sensory deficit. He exhibits normal muscle tone. He displays no seizure activity. Coordination normal.  Skin: Skin is warm and dry.  Psychiatric: He has a normal mood and affect.  Nursing note and vitals reviewed.    ED Treatments / Results  Labs (all labs ordered are listed, but only abnormal results are displayed) Labs Reviewed  I-STAT CHEM 8, ED - Abnormal; Notable for the following:       Result Value   Glucose, Bld 103 (*)    All other components within normal limits     Procedures Procedures (including critical care time)  Medications Ordered in ED Medications  ketorolac (TORADOL) 30 MG/ML injection 30 mg (not administered)     Initial Impression / Assessment and Plan / ED Course  I have reviewed the triage vital signs and the nursing notes.  Pertinent labs & imaging results that were available during my care of the patient were reviewed by me and considered in my medical decision making (see chart for details).  Clinical Course   Well-appearing male presents with ongoing pain in the neck and back. Patient has had dysesthesia in the right external, though this is subjective, and not reproducible with neurologic testing. Patient's presentation most consistent with musculoskeletal pain. Patient started a course of medication, discharged in stable condition to follow-up with physiotherapy, primary care.   Final Clinical Impressions(s) / ED Diagnoses   musculoskeletal pain   Gerhard Munchobert Maryclaire Stoecker, MD 02/20/16 [phone]

## 2016-02-20 NOTE — Discharge Instructions (Signed)
As discussed, your evaluation today has been largely reassuring.  But, it is important that you monitor your condition carefully, and do not hesitate to return to the ED if you develop new, or concerning changes in your condition. ? ?Otherwise, please follow-up with your physician for appropriate ongoing care. ? ?

## 2016-06-18 IMAGING — US US ABDOMEN LIMITED
1 series · 14 of 22 positions shown · non-contrast
Comparison: None.

CLINICAL DATA: Right upper quadrant pain

EXAM:
US ABDOMEN LIMITED - RIGHT UPPER QUADRANT

[Series 1: us abdomen limited · 0.27mm/px · 14 of 22 slices shown]
[im 1/22]
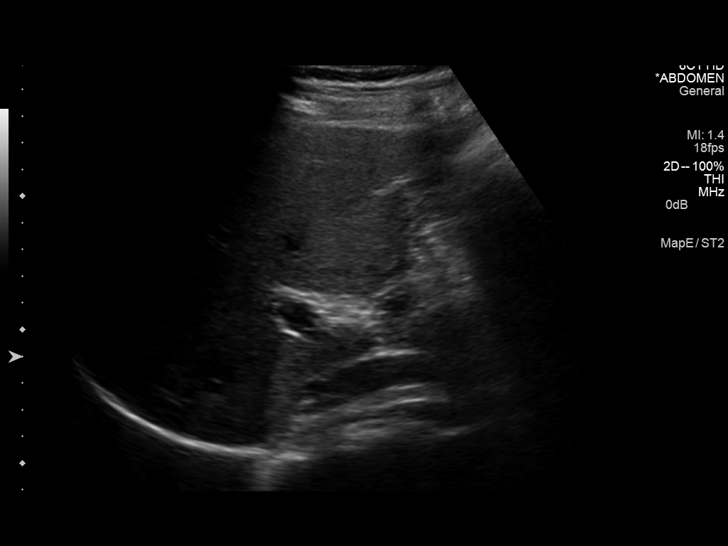
[im 3/22]
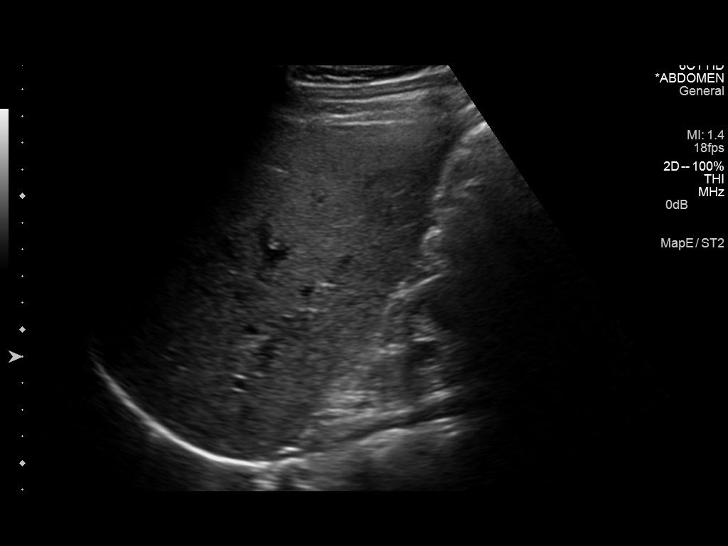
[im 4/22]
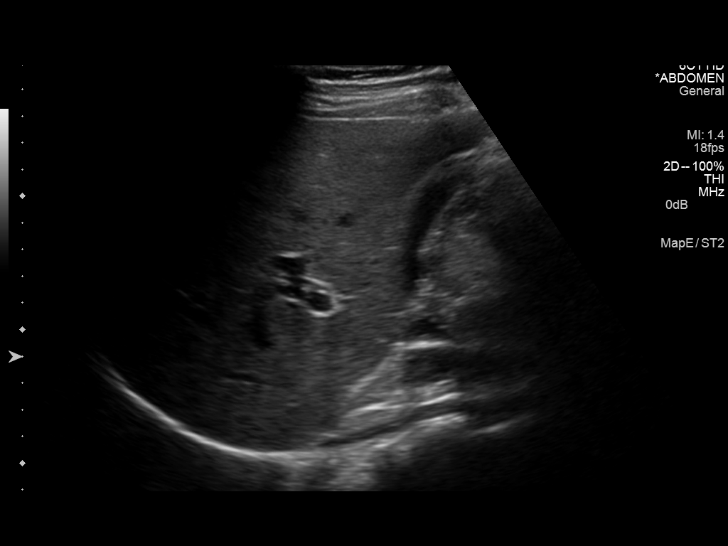
[im 6/22]
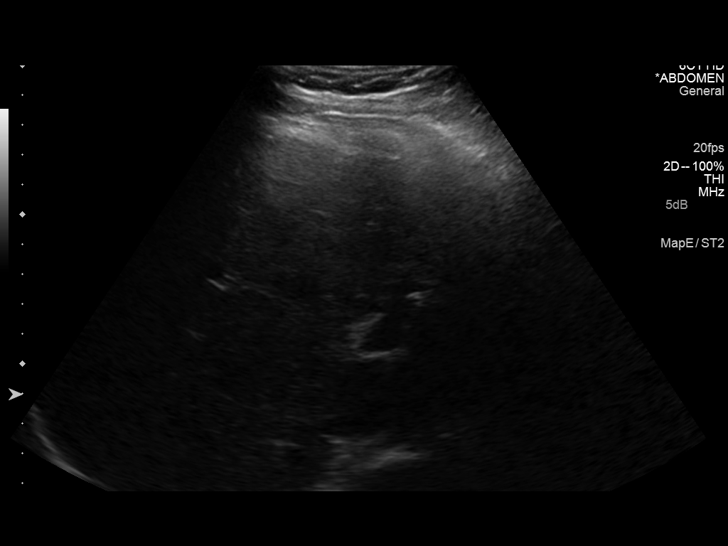
[im 8/22]
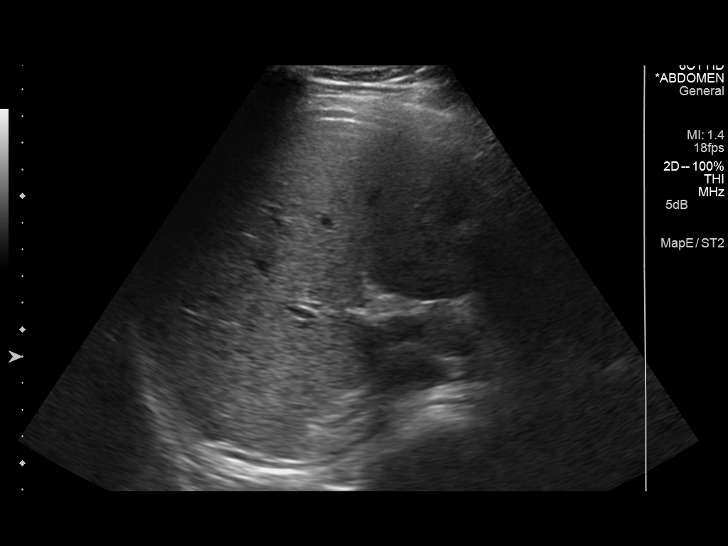
[im 9/22]
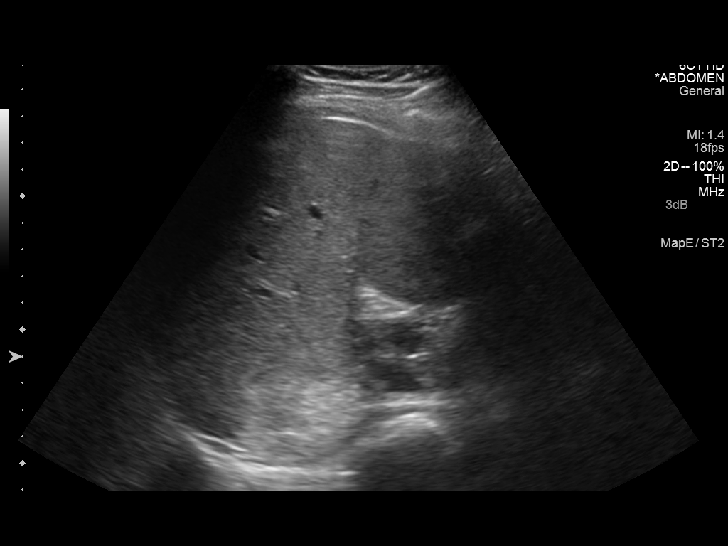
[im 11/22]
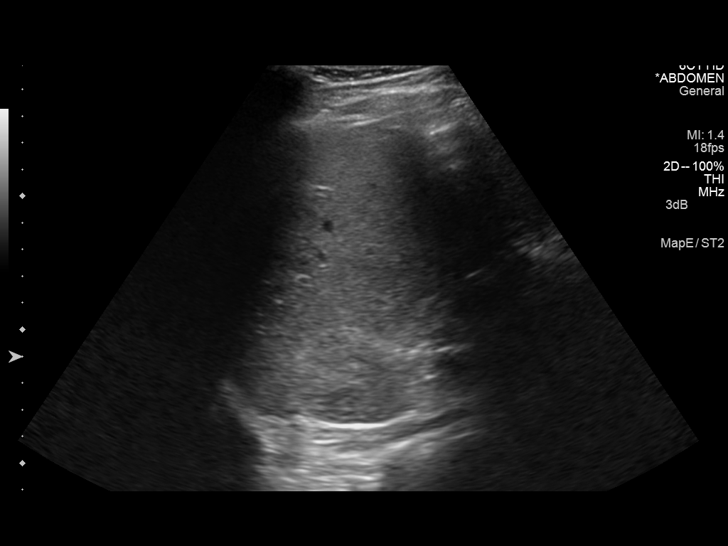
[im 12/22]
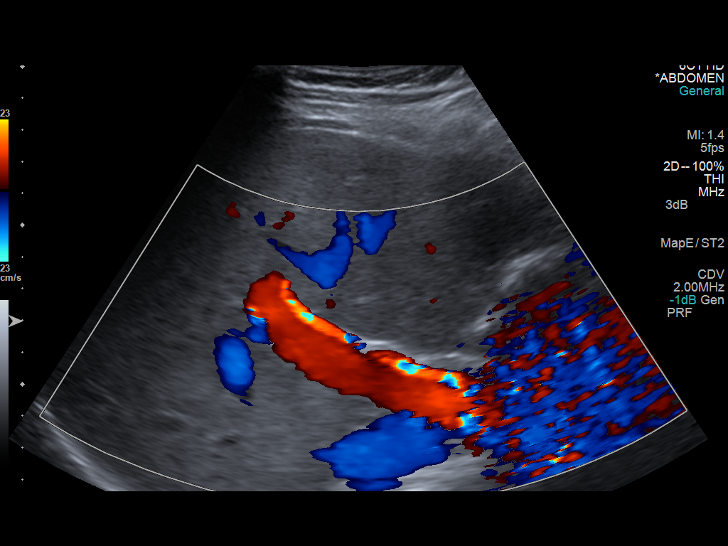
[im 14/22]
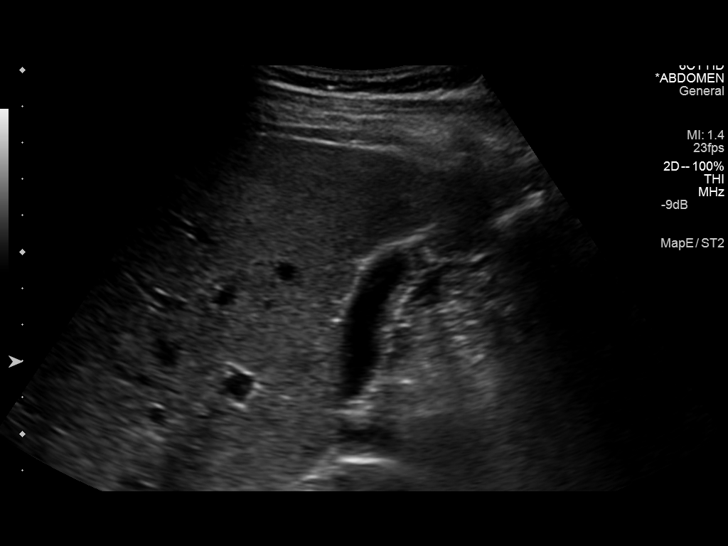
[im 15/22]
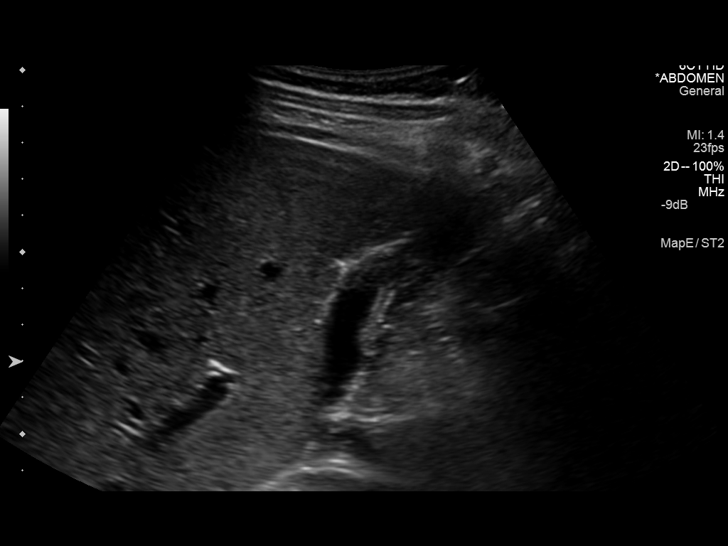
[im 17/22]
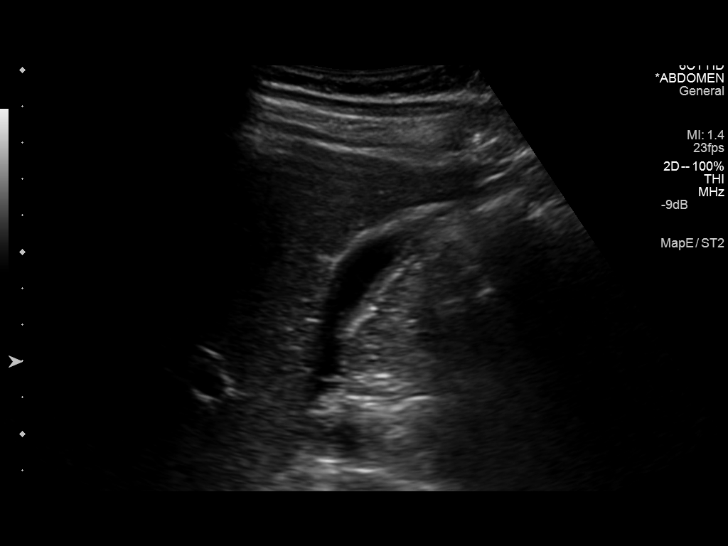
[im 19/22]
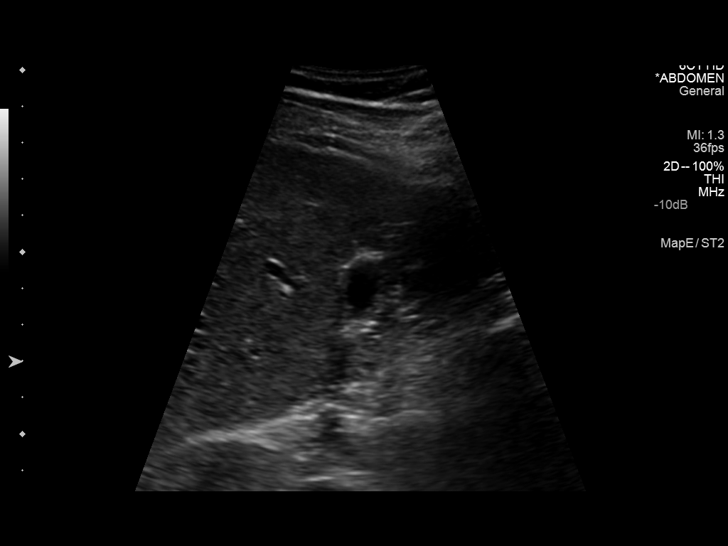
[im 20/22]
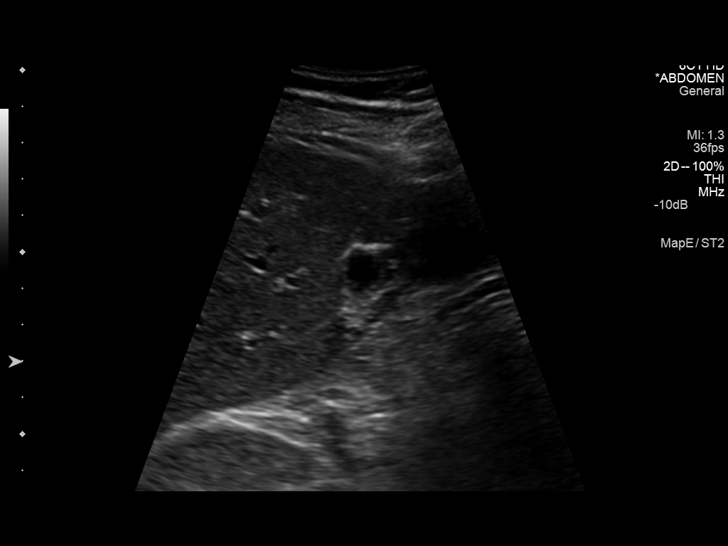
[im 22/22]
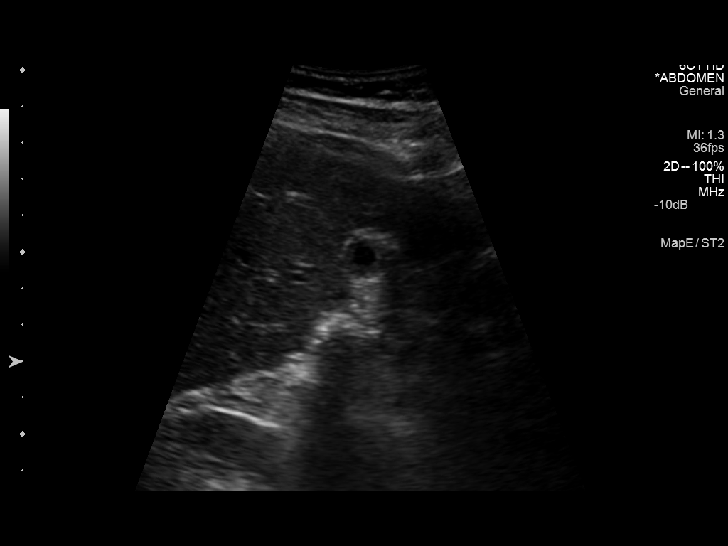

[14 of 22 positions shown; findings below may reference images not displayed]

FINDINGS: Gallbladder:

No evidence of gallstone. There is partial gallbladder contraction
without wall thickening or focal tenderness.

Common bile duct:

Diameter: 4 mm

Liver:

No focal lesion identified. Within normal limits in parenchymal
echogenicity.
IMPRESSION: Negative right upper quadrant ultrasound.

## 2016-10-21 ENCOUNTER — Encounter (HOSPITAL_COMMUNITY): Payer: Self-pay | Admitting: Emergency Medicine

## 2016-10-21 ENCOUNTER — Emergency Department (HOSPITAL_COMMUNITY)
Admission: EM | Admit: 2016-10-21 | Discharge: 2016-10-21 | Disposition: A | Discharge status: Home / Self Care | Payer: PRIVATE HEALTH INSURANCE | Attending: Emergency Medicine | Admitting: Emergency Medicine

## 2016-10-21 DIAGNOSIS — Z79899 Other long term (current) drug therapy: Secondary | ICD-10-CM | POA: Insufficient documentation

## 2016-10-21 DIAGNOSIS — Y929 Unspecified place or not applicable: Secondary | ICD-10-CM | POA: Insufficient documentation

## 2016-10-21 DIAGNOSIS — T148XXA Other injury of unspecified body region, initial encounter: Secondary | ICD-10-CM

## 2016-10-21 DIAGNOSIS — F17210 Nicotine dependence, cigarettes, uncomplicated: Secondary | ICD-10-CM | POA: Insufficient documentation

## 2016-10-21 DIAGNOSIS — S39012A Strain of muscle, fascia and tendon of lower back, initial encounter: Secondary | ICD-10-CM | POA: Insufficient documentation

## 2016-10-21 DIAGNOSIS — X58XXXA Exposure to other specified factors, initial encounter: Secondary | ICD-10-CM | POA: Insufficient documentation

## 2016-10-21 DIAGNOSIS — R109 Unspecified abdominal pain: Secondary | ICD-10-CM | POA: Insufficient documentation

## 2016-10-21 DIAGNOSIS — Y939 Activity, unspecified: Secondary | ICD-10-CM | POA: Insufficient documentation

## 2016-10-21 DIAGNOSIS — Y999 Unspecified external cause status: Secondary | ICD-10-CM | POA: Insufficient documentation

## 2016-10-21 LAB — CBC WITH DIFFERENTIAL/PLATELET
BASOS PCT: 0 %
Basophils Absolute: 0 10*3/uL (ref 0.0–0.1)
EOS ABS: 0.1 10*3/uL (ref 0.0–0.7)
Eosinophils Relative: 2 %
HCT: 39.7 % (ref 39.0–52.0)
Hemoglobin: 13.5 g/dL (ref 13.0–17.0)
LYMPHS ABS: 2.3 10*3/uL (ref 0.7–4.0)
Lymphocytes Relative: 49 %
MCH: 30.6 pg (ref 26.0–34.0)
MCHC: 34 g/dL (ref 30.0–36.0)
MCV: 90 fL (ref 78.0–100.0)
MONO ABS: 0.3 10*3/uL (ref 0.1–1.0)
MONOS PCT: 7 %
Neutro Abs: 2 10*3/uL (ref 1.7–7.7)
Neutrophils Relative %: 42 %
Platelets: 148 10*3/uL — ABNORMAL LOW (ref 150–400)
RBC: 4.41 MIL/uL (ref 4.22–5.81)
RDW: 13.1 % (ref 11.5–15.5)
WBC: 4.7 10*3/uL (ref 4.0–10.5)

## 2016-10-21 LAB — COMPREHENSIVE METABOLIC PANEL
ALBUMIN: 3.8 g/dL (ref 3.5–5.0)
ALK PHOS: 75 U/L (ref 38–126)
ALT: 48 U/L (ref 17–63)
AST: 29 U/L (ref 15–41)
Anion gap: 5 (ref 5–15)
BUN: 17 mg/dL (ref 6–20)
CALCIUM: 8.9 mg/dL (ref 8.9–10.3)
CO2: 25 mmol/L (ref 22–32)
Chloride: 110 mmol/L (ref 101–111)
Creatinine, Ser: 1.09 mg/dL (ref 0.61–1.24)
GFR calc non Af Amer: >60 mL/min (ref >60)
GLUCOSE: 106 mg/dL — AB (ref 65–99)
POTASSIUM: 3.6 mmol/L (ref 3.5–5.1)
SODIUM: 140 mmol/L (ref 135–145)
TOTAL PROTEIN: 6.9 g/dL (ref 6.5–8.1)
Total Bilirubin: 0.8 mg/dL (ref 0.3–1.2)

## 2016-10-21 LAB — LIPASE, BLOOD: Lipase: 31 U/L (ref 11–51)

## 2016-10-21 MED ORDER — ALUM & MAG HYDROXIDE-SIMETH 200-200-20 MG/5ML PO SUSP
30.0000 mL | Freq: Once | ORAL | Status: AC
  Administered 2016-10-21: 30 mL via ORAL
  Filled 2016-10-21: qty 30

## 2016-10-21 NOTE — ED Provider Notes (Signed)
[facility]-EMERGENCY DEPT Provider Note   CSN: [id number] Arrival date & time: 10/21/16  0847     History   Chief Complaint Chief Complaint  Patient presents with  . Back Pain    HPI Dustin Watkins is a 31 y.o. male.  The history is provided by the patient.  Flank Pain  This is a new problem. The current episode started 2 days ago. The problem occurs constantly. Progression since onset: fluctuating. Pertinent negatives include no chest pain, no headaches and no shortness of breath. The symptoms are aggravated by bending, twisting, coughing and standing (with BM). The symptoms are relieved by NSAIDs. Treatments tried: NSAID. The treatment provided moderate relief.   States he might have twisted when playing with his children.   Past Medical History:  Diagnosis Date  . Bronchitis     Patient Active Problem List   Diagnosis Date Noted  . Viral gastroenteritis 09/23/2013  . Hyperglycemia 03/01/2013  . Prediabetes 03/01/2013    History reviewed. No pertinent surgical history.     Home Medications    Prior to Admission medications   Medication Sig Start Date End Date Taking? Authorizing Provider  naproxen sodium (ALEVE) 220 MG tablet Take 220 mg by mouth every 8 (eight) hours as needed (For pain.).   Yes Historical Provider, MD  naproxen (NAPROSYN) 500 MG tablet Take 1 tablet (500 mg total) by mouth 2 (two) times daily. Patient not taking: Reported on 02/20/2016 10/11/14   Kathrynn Speed, PA-C  ranitidine (ZANTAC) 150 MG/10ML syrup Take 10 mLs (150 mg total) by mouth 3 (three) times daily as needed for heartburn. Patient not taking: Reported on 02/20/2016 07/01/14   Tomasita Crumble, MD    Family History Family History  Problem Relation Age of Onset  . Hypertension Other   . Diabetes Other     Social History Social History  Substance Use Topics  . Smoking status: Current Every Day Smoker    Packs/day: 0.20    Types: Cigarettes  . Smokeless tobacco: Never Used  . Alcohol use  Yes     Comment: occ     Allergies   Patient has no known allergies.   Review of Systems Review of Systems  Respiratory: Negative for shortness of breath.   Cardiovascular: Negative for chest pain.  Genitourinary: Positive for flank pain.  Neurological: Negative for headaches.  All other systems are reviewed and are negative for acute change except as noted in the HPI    Physical Exam Updated Vital Signs BP (!) 149/79 (BP Location: Right Arm)   Pulse 84   Temp 98.1 F (36.7 C) (Oral)   Resp 18   Ht  (1.702 m)   Wt 185 lb (83.9 kg)   SpO2 94%   BMI 28.98 kg/m   Physical Exam  Constitutional: He is oriented to person, place, and time. He appears well-developed and well-nourished. No distress.  HENT:  Head: Normocephalic and atraumatic.  Nose: Nose normal.  Eyes: Conjunctivae and EOM are normal. Pupils are equal, round, and reactive to light. Right eye exhibits no discharge. Left eye exhibits no discharge. No scleral icterus.  Neck: Normal range of motion. Neck supple.  Cardiovascular: Normal rate and regular rhythm.  Exam reveals no gallop and no friction rub.   No murmur heard. Pulmonary/Chest: Effort normal and breath sounds normal. No stridor. No respiratory distress. He has no rales.  Abdominal: Soft. He exhibits no distension. There is tenderness (left flank). There is no rigidity, no rebound,  no guarding and no CVA tenderness.    Musculoskeletal: He exhibits no edema or tenderness.  Neurological: He is alert and oriented to person, place, and time.  Skin: Skin is warm and dry. No rash noted. He is not diaphoretic. No erythema.  Psychiatric: He has a normal mood and affect.  Vitals reviewed.    ED Treatments / Results  Labs (all labs ordered are listed, but only abnormal results are displayed) Labs Reviewed  CBC WITH DIFFERENTIAL/PLATELET - Abnormal; Notable for the following:       Result Value   Platelets 148 (*)    All other components within  normal limits  COMPREHENSIVE METABOLIC PANEL - Abnormal; Notable for the following:    Glucose, Bld 106 (*)    All other components within normal limits  LIPASE, BLOOD    EKG  EKG Interpretation None       Radiology No results found.  Procedures Procedures (including critical care time)  Medications Ordered in ED Medications  alum & mag hydroxide-simeth (MAALOX/MYLANTA) 200-200-20 MG/5ML suspension 30 mL (30 mLs Oral Given 10/21/16 1001)     Initial Impression / Assessment and Plan / ED Course  I have reviewed the triage vital signs and the nursing notes.  Pertinent labs & imaging results that were available during my care of the patient were reviewed by me and considered in my medical decision making (see chart for details).     Left flank pain. Abdominal exam without evidence of peritonitis. Labs gross reassuring without evidence of pancreatitis or biliary obstruction. Patient denies any urinary symptoms suggestive of pyelonephritis or renal colic.  Most consistent with MSK pain. Symptomatic treatment recommended with OTC meds.  The patient is safe for discharge with strict return precautions.   Final diagnoses:  Flank pain  Muscle strain   Disposition: Discharge  Condition: Good  I have discussed the results, Dx and Tx plan with the patient who expressed understanding and agree(s) with the plan. Discharge instructions discussed at great length. The patient was given strict return precautions who verbalized understanding of the instructions. No further questions at time of discharge.    New Prescriptions   No medications on file    Follow Up: primary care provider  Schedule an appointment as soon as possible for a visit        Nira Conn, MD 10/21/16 1138

## 2016-10-21 NOTE — ED Triage Notes (Signed)
Patient reports left back pain x 2 days. Denies urinary symptoms. Denies injury/trauma to the area. Pain is worse with movement. Patient ambulatory to triage.

## 2023-10-09 ENCOUNTER — Other Ambulatory Visit: Payer: Self-pay

## 2023-10-09 ENCOUNTER — Encounter (HOSPITAL_COMMUNITY): Payer: Self-pay

## 2023-10-09 ENCOUNTER — Emergency Department (HOSPITAL_COMMUNITY)
Admission: EM | Admit: 2023-10-09 | Discharge: 2023-10-09 | Disposition: A | Discharge status: Home / Self Care | Attending: Emergency Medicine | Admitting: Emergency Medicine

## 2023-10-09 DIAGNOSIS — G510 Bell's palsy: Secondary | ICD-10-CM | POA: Insufficient documentation

## 2023-10-09 DIAGNOSIS — R7309 Other abnormal glucose: Secondary | ICD-10-CM | POA: Insufficient documentation

## 2023-10-09 DIAGNOSIS — R29810 Facial weakness: Secondary | ICD-10-CM | POA: Diagnosis present

## 2023-10-09 LAB — CBG MONITORING, ED: Glucose-Capillary: 156 mg/dL — ABNORMAL HIGH (ref 70–99)

## 2023-10-09 MED ORDER — VALACYCLOVIR HCL 1 G PO TABS: 1000.0000 mg | ORAL_TABLET | Freq: Three times a day (TID) | ORAL | 0 refills | Status: DC

## 2023-10-09 MED ORDER — PREDNISONE 20 MG PO TABS: 60.0000 mg | ORAL_TABLET | Freq: Every day | ORAL | 0 refills | Status: DC

## 2023-10-09 MED ORDER — VALACYCLOVIR HCL 1 G PO TABS: 1000.0000 mg | ORAL_TABLET | Freq: Three times a day (TID) | ORAL | 0 refills | Status: AC

## 2023-10-09 MED ORDER — PREDNISONE 20 MG PO TABS: 60.0000 mg | ORAL_TABLET | Freq: Every day | ORAL | 0 refills | Status: AC

## 2023-10-09 NOTE — ED Triage Notes (Signed)
 Right sided facial paralysis that started at 2100 last night, no extremity weakness or deficit. C/o right sided headache. No other sx.

## 2023-10-09 NOTE — ED Provider Notes (Signed)
 [place] EMERGENCY DEPARTMENT AT [facility] Provider Note   CSN: [id number] Arrival date & time: 10/09/23  1350     History  Chief Complaint  Patient presents with   Facial Droop   Headache    Dustin Watkins is a 38 y.o. male.   Headache Patient resents with facial droop.  Reportedly last night felt a little off on his face.  They saw that his patient really was not working correctly.  States his daughter said he was winking.  Slight headache.  No other numbness or weakness.  Has not had symptoms like this before.    Past Medical History:  Diagnosis Date   Bronchitis     Home Medications Prior to Admission medications   Medication Sig Start Date End Date Taking? Authorizing Provider  predniSONE (DELTASONE) 20 MG tablet Take 3 tablets (60 mg total) by mouth daily for 5 days. 10/09/23 10/14/23 Yes Benjiman Core, MD  valACYclovir (VALTREX) 1000 MG tablet Take 1 tablet (1,000 mg total) by mouth 3 (three) times daily for 7 days. 10/09/23 10/16/23 Yes Benjiman Core, MD  naproxen sodium (ALEVE) 220 MG tablet Take 220 mg by mouth every 8 (eight) hours as needed (For pain.).    [provider]      Allergies    Patient has no known allergies.    Review of Systems   Review of Systems  Neurological:  Positive for headaches.    Physical Exam Updated Vital Signs BP (!) 162/95 (BP Location: Right Arm)   Pulse 78   Temp 97.8 F (36.6 C) (Oral)   Resp 16   Ht 5\' 7"  (1.702 m)   Wt 83.9 kg   SpO2 98%   BMI 28.97 kg/m  Physical Exam Vitals and nursing note reviewed.  HENT:     Head: Atraumatic.  Eyes:     Extraocular Movements: Extraocular movements intact.  Musculoskeletal:     Cervical back: Neck supple.  Neurological:     Mental Status: He is alert.     Comments: Patient with sensation intact over face.  Decreased eyebrow raise on right side.  Decreased closing of right eye.  Decreased smile on right side.  Eye movements intact.     ED  Results / Procedures / Treatments   Labs (all labs ordered are listed, but only abnormal results are displayed) Labs Reviewed - No data to display  EKG None  Radiology No results found.  Procedures Procedures    Medications Ordered in ED Medications - No data to display  ED Course/ Medical Decision Making/ A&P                                 Medical Decision Making Risk Prescription drug management.   Patient appears to have a peripheral 7th nerve palsy.  Most likely Bell's palsy.  Other diagnosis such as multiple sclerosis or stroke felt less likely.  Will treat with antivirals and steroids.  Follow-up with neurology as needed.  Will discharge home.        Final Clinical Impression(s) / ED Diagnoses Final diagnoses:  Bell's palsy    Rx / DC Orders ED Discharge Orders          Ordered    Ambulatory referral to Neurology       Comments: An appointment is requested in approximately: 2 weeks   10/09/23 1405    valACYclovir (VALTREX) 1000 MG tablet  3 times daily        10/09/23 1405    predniSONE (DELTASONE) 20 MG tablet  Daily        10/09/23 1405              Benjiman Core, MD 10/09/23 1407

## 2023-10-09 NOTE — ED Notes (Signed)
 Dr. Rubin Payor notified of pt facial paralysis of right side. Dr. Rubin Payor at bedside

## 2023-11-05 ENCOUNTER — Ambulatory Visit (INDEPENDENT_AMBULATORY_CARE_PROVIDER_SITE_OTHER): Admitting: Neurology

## 2023-11-05 ENCOUNTER — Encounter: Payer: Self-pay | Admitting: Neurology

## 2023-11-05 VITALS — BP 132/82 | HR 78 | Ht 67.0 in | Wt 229.0 lb

## 2023-11-05 DIAGNOSIS — G510 Bell's palsy: Secondary | ICD-10-CM

## 2023-11-05 NOTE — Patient Instructions (Signed)
 Continue current medications  Continue to follow up with PCP  Return as needed

## 2023-11-05 NOTE — Progress Notes (Signed)
 [facility]  PATIENT: Dustin Watkins DOB: Nov 22, 1985  REQUESTING CLINICIAN: Mozell Arias, MD HISTORY FROM: Patient  REASON FOR VISIT: Right sided Bells Palsy    HISTORICAL  CHIEF COMPLAINT:  Chief Complaint  Patient presents with   New Patient (Initial Visit)    RM12, ALONE, internal referral for Bell's Palsy: RIGHT SIDE of face but has almost returned to normal (ongoing 3 weeks)    HISTORY OF PRESENT ILLNESS:  This is a 38 year old male with no reported past medical history who is presenting for follow-up for Bell's palsy.  On March 27, he woke up with her right eye tearing, then noted some right facial weakness.  He presented to the hospital, diagnosed with Bell's palsy, given prednisone  and Valacyclovir .  Patient completed treatment, reports some improvement and his symptoms are almost back to normal.  He did not even have to tape his eye at night.  Denies any other complaints, no ear pain, no changes in sensation in his right face.   OTHER MEDICAL CONDITIONS: None reported    REVIEW OF SYSTEMS: Full 14 system review of systems performed and negative with exception of: As noted in the HPI   ALLERGIES: No Known Allergies  HOME MEDICATIONS: Outpatient Medications Prior to Visit  Medication Sig Dispense Refill   naproxen  sodium (ALEVE ) 220 MG tablet Take 220 mg by mouth every 8 (eight) hours as needed (For pain.). (Patient not taking: Reported on 11/05/2023)     No facility-administered medications prior to visit.    PAST MEDICAL HISTORY: Past Medical History:  Diagnosis Date   Bronchitis     PAST SURGICAL HISTORY: History reviewed. No pertinent surgical history.  FAMILY HISTORY: Family History  Problem Relation Age of Onset   Hypertension Other    Diabetes Other     SOCIAL HISTORY: Social History   Socioeconomic History   Marital status: Single    Spouse name: Not on file   Number of children: 4   Years of education: Not on  file   Highest education level: 12th grade  Occupational History   Not on file  Tobacco Use   Smoking status: Former    Types: Cigarettes   Smokeless tobacco: Never  Vaping Use   Vaping status: Never Used  Substance and Sexual Activity   Alcohol use: Not Currently    Comment: occ   Drug use: Not on file   Sexual activity: Yes    Birth control/protection: None  Other Topics Concern   Not on file  Social History Narrative   Not on file   Social Drivers of Health   Financial Resource Strain: Not on file  Food Insecurity: Not on file  Transportation Needs: Not on file  Physical Activity: Not on file  Stress: Not on file  Social Connections: Not on file  Intimate Partner Violence: Not on file    PHYSICAL EXAM  GENERAL EXAM/CONSTITUTIONAL: Vitals:  Vitals:   11/05/23 0914  BP: 132/82  Pulse: 78  Weight: 229 lb (103.9 kg)  Height: 5\' 7"  (1.702 m)   Body mass index is 35.87 kg/m. Wt Readings from Last 3 Encounters:  11/05/23 229 lb (103.9 kg)  10/09/23 184 lb 15.5 oz (83.9 kg)  10/21/16 185 lb (83.9 kg)   Patient is in no distress; well developed, nourished and groomed; neck is supple  MUSCULOSKELETAL: Gait, strength, tone, movements noted in Neurologic exam below  NEUROLOGIC: MENTAL STATUS:      No data to display  awake, alert, oriented to person, place and time recent and remote memory intact normal attention and concentration language fluent, comprehension intact, naming intact fund of knowledge appropriate  CRANIAL NERVE:  2nd, 3rd, 4th, 6th - Visual fields full to confrontation, extraocular muscles intact, no nystagmus 5th - facial sensation symmetric 7th - facial strength symmetric. Able to close eye shut, rise eyebrow. Very mild asymmetry when smiling and puffing his cheeks.  8th - hearing intact 9th - palate elevates symmetrically, uvula midline 11th - shoulder shrug symmetric 12th - tongue protrusion midline  MOTOR:  normal bulk  and tone, full strength in the BUE, BLE  SENSORY:  normal and symmetric to light touch  COORDINATION:  fine finger movements normal  GAIT/STATION:  normal     DIAGNOSTIC DATA (LABS, IMAGING, TESTING) - I reviewed patient records, labs, notes, testing and imaging myself where available.  Lab Results  Component Value Date   WBC 4.7 10/21/2016   HGB 13.5 10/21/2016   HCT 39.7 10/21/2016   MCV 90.0 10/21/2016   PLT 148 (L) 10/21/2016      Component Value Date/Time   NA 140 10/21/2016 1000   K 3.6 10/21/2016 1000   CL 110 10/21/2016 1000   CO2 25 10/21/2016 1000   GLUCOSE 106 (H) 10/21/2016 1000   BUN 17 10/21/2016 1000   CREATININE 1.09 10/21/2016 1000   CREATININE 1.02 03/01/2013 1747   CALCIUM 8.9 10/21/2016 1000   PROT 6.9 10/21/2016 1000   ALBUMIN 3.8 10/21/2016 1000   AST 29 10/21/2016 1000   ALT 48 10/21/2016 1000   ALKPHOS 75 10/21/2016 1000   BILITOT 0.8 10/21/2016 1000   GFRNONAA >60 10/21/2016 1000   GFRNONAA >89 03/01/2013 1747   GFRAA >60 10/21/2016 1000   GFRAA >89 03/01/2013 1747   Lab Results  Component Value Date   CHOL 143 03/01/2013   HDL 45 03/01/2013   LDLCALC 90 03/01/2013   TRIG 40 03/01/2013   CHOLHDL 3.2 03/01/2013   Lab Results  Component Value Date   HGBA1C 5.6 03/01/2013   No results found for: "VITAMINB12" No results found for: "TSH"   ASSESSMENT AND PLAN  38 y.o. year old male with with no reported past medical history who is presenting after being diagnosed with Bell's palsy on March 27.  He completed treatment with prednisone  and antiviral with good improvement of his symptoms, almost back to normal.  No additional workup indicated at the moment.  Please continue to follow with PCP and return as needed.   1. Bell's palsy      Patient Instructions  Continue current medications Continue to follow up with PCP  Return as needed   No orders of the defined types were placed in this encounter.   No orders of the  defined types were placed in this encounter.   Return if symptoms worsen or fail to improve.    Cassandra Cleveland, MD 11/05/2023, 9:31 AM  [facility] [address], [address] [place], [place] [address] [phone]

## 2023-11-06 ENCOUNTER — Ambulatory Visit: Admitting: Neurology
